# Patient Record
Sex: Female | Born: 1945 | Race: White | Hispanic: No | Smoking: Former smoker
Health system: Southern US, Community
[De-identification: ages and names within clinical notes are randomized; demographics above are authoritative.]

## PROBLEM LIST (undated history)

## (undated) DIAGNOSIS — R0602 Shortness of breath: Secondary | ICD-10-CM

## (undated) DIAGNOSIS — K222 Esophageal obstruction: Secondary | ICD-10-CM

## (undated) DIAGNOSIS — I779 Disorder of arteries and arterioles, unspecified: Secondary | ICD-10-CM

## (undated) DIAGNOSIS — I2510 Atherosclerotic heart disease of native coronary artery without angina pectoris: Secondary | ICD-10-CM

## (undated) DIAGNOSIS — Z951 Presence of aortocoronary bypass graft: Secondary | ICD-10-CM

## (undated) DIAGNOSIS — I739 Peripheral vascular disease, unspecified: Secondary | ICD-10-CM

## (undated) DIAGNOSIS — Z8669 Personal history of other diseases of the nervous system and sense organs: Secondary | ICD-10-CM

## (undated) DIAGNOSIS — F329 Major depressive disorder, single episode, unspecified: Secondary | ICD-10-CM

## (undated) DIAGNOSIS — F32A Depression, unspecified: Secondary | ICD-10-CM

## (undated) DIAGNOSIS — I10 Essential (primary) hypertension: Secondary | ICD-10-CM

## (undated) DIAGNOSIS — M1990 Unspecified osteoarthritis, unspecified site: Secondary | ICD-10-CM

## (undated) DIAGNOSIS — E785 Hyperlipidemia, unspecified: Secondary | ICD-10-CM

## (undated) DIAGNOSIS — F419 Anxiety disorder, unspecified: Secondary | ICD-10-CM

## (undated) DIAGNOSIS — K219 Gastro-esophageal reflux disease without esophagitis: Secondary | ICD-10-CM

## (undated) HISTORY — DX: Disorder of arteries and arterioles, unspecified: I77.9

## (undated) HISTORY — DX: Shortness of breath: R06.02

## (undated) HISTORY — DX: Depression, unspecified: F32.A

## (undated) HISTORY — DX: Major depressive disorder, single episode, unspecified: F32.9

## (undated) HISTORY — PX: HERNIA REPAIR: SHX51

## (undated) HISTORY — PX: TUBAL LIGATION: SHX77

## (undated) HISTORY — DX: Esophageal obstruction: K22.2

## (undated) HISTORY — DX: Atherosclerotic heart disease of native coronary artery without angina pectoris: I25.10

## (undated) HISTORY — DX: Peripheral vascular disease, unspecified: I73.9

## (undated) HISTORY — DX: Hyperlipidemia, unspecified: E78.5

## (undated) HISTORY — PX: OTHER SURGICAL HISTORY: SHX169

## (undated) HISTORY — DX: Essential (primary) hypertension: I10

## (undated) HISTORY — PX: HEMORRHOID SURGERY: SHX153

## (undated) HISTORY — DX: Presence of aortocoronary bypass graft: Z95.1

## (undated) HISTORY — DX: Gastro-esophageal reflux disease without esophagitis: K21.9

## (undated) HISTORY — DX: Personal history of other diseases of the nervous system and sense organs: Z86.69

## (undated) HISTORY — DX: Anxiety disorder, unspecified: F41.9

---

## 1997-07-28 HISTORY — PX: OTHER SURGICAL HISTORY: SHX169

## 1998-07-28 HISTORY — PX: CORONARY ARTERY BYPASS GRAFT: SHX141

## 1998-12-17 ENCOUNTER — Encounter: Payer: Self-pay | Admitting: Vascular Surgery

## 1998-12-18 ENCOUNTER — Inpatient Hospital Stay (HOSPITAL_COMMUNITY): Admission: RE | Admit: 1998-12-18 | Discharge: 1998-12-21 | Payer: Self-pay | Admitting: Vascular Surgery

## 1999-04-10 ENCOUNTER — Inpatient Hospital Stay (HOSPITAL_COMMUNITY): Admission: EM | Admit: 1999-04-10 | Discharge: 1999-04-19 | Payer: Self-pay | Admitting: Cardiology

## 1999-04-15 ENCOUNTER — Encounter: Payer: Self-pay | Admitting: Thoracic Surgery (Cardiothoracic Vascular Surgery)

## 1999-04-16 ENCOUNTER — Encounter: Payer: Self-pay | Admitting: Thoracic Surgery (Cardiothoracic Vascular Surgery)

## 1999-04-17 ENCOUNTER — Encounter: Payer: Self-pay | Admitting: Thoracic Surgery (Cardiothoracic Vascular Surgery)

## 1999-04-18 ENCOUNTER — Encounter: Payer: Self-pay | Admitting: Thoracic Surgery (Cardiothoracic Vascular Surgery)

## 2007-11-26 ENCOUNTER — Ambulatory Visit: Payer: Self-pay | Admitting: Internal Medicine

## 2007-12-03 ENCOUNTER — Ambulatory Visit (HOSPITAL_COMMUNITY): Admission: RE | Admit: 2007-12-03 | Discharge: 2007-12-03 | Payer: Self-pay | Admitting: Internal Medicine

## 2007-12-03 ENCOUNTER — Ambulatory Visit: Payer: Self-pay | Admitting: Internal Medicine

## 2008-10-12 ENCOUNTER — Encounter: Payer: Self-pay | Admitting: Cardiology

## 2008-10-12 ENCOUNTER — Encounter: Payer: Self-pay | Admitting: Physician Assistant

## 2008-10-13 ENCOUNTER — Ambulatory Visit: Payer: Self-pay | Admitting: Internal Medicine

## 2008-10-13 ENCOUNTER — Encounter: Payer: Self-pay | Admitting: Cardiology

## 2008-10-13 ENCOUNTER — Ambulatory Visit: Payer: Self-pay | Admitting: Cardiology

## 2008-10-13 ENCOUNTER — Inpatient Hospital Stay (HOSPITAL_COMMUNITY): Admission: EM | Admit: 2008-10-13 | Discharge: 2008-10-19 | Payer: Self-pay | Admitting: Cardiology

## 2008-10-13 ENCOUNTER — Encounter: Payer: Self-pay | Admitting: Cardiovascular Disease

## 2008-10-15 ENCOUNTER — Encounter: Payer: Self-pay | Admitting: Cardiology

## 2008-10-19 ENCOUNTER — Encounter: Payer: Self-pay | Admitting: Cardiology

## 2008-11-02 ENCOUNTER — Ambulatory Visit: Payer: Self-pay | Admitting: Cardiology

## 2009-02-14 ENCOUNTER — Encounter (INDEPENDENT_AMBULATORY_CARE_PROVIDER_SITE_OTHER): Payer: Self-pay | Admitting: *Deleted

## 2009-02-14 ENCOUNTER — Ambulatory Visit: Payer: Self-pay | Admitting: Cardiology

## 2009-02-21 ENCOUNTER — Ambulatory Visit: Payer: Self-pay | Admitting: Cardiology

## 2009-02-26 ENCOUNTER — Encounter: Payer: Self-pay | Admitting: Internal Medicine

## 2009-03-05 ENCOUNTER — Encounter (INDEPENDENT_AMBULATORY_CARE_PROVIDER_SITE_OTHER): Payer: Self-pay | Admitting: *Deleted

## 2009-03-09 ENCOUNTER — Ambulatory Visit: Payer: Self-pay | Admitting: Cardiology

## 2009-03-13 ENCOUNTER — Encounter: Payer: Self-pay | Admitting: Internal Medicine

## 2009-09-18 ENCOUNTER — Encounter: Payer: Self-pay | Admitting: Cardiology

## 2009-10-17 ENCOUNTER — Encounter: Payer: Self-pay | Admitting: Cardiology

## 2009-10-17 DIAGNOSIS — K219 Gastro-esophageal reflux disease without esophagitis: Secondary | ICD-10-CM | POA: Insufficient documentation

## 2009-10-18 ENCOUNTER — Ambulatory Visit: Payer: Self-pay | Admitting: Cardiology

## 2010-06-24 ENCOUNTER — Ambulatory Visit: Payer: Self-pay | Admitting: Cardiology

## 2010-06-26 ENCOUNTER — Encounter: Payer: Self-pay | Admitting: Cardiology

## 2010-06-28 ENCOUNTER — Encounter (INDEPENDENT_AMBULATORY_CARE_PROVIDER_SITE_OTHER): Payer: Self-pay | Admitting: *Deleted

## 2010-08-27 NOTE — Letter (Signed)
Summary: Engineer, materials at [facility]  [address]   [place], [place] [address]   Phone: [phone]  Fax: [phone]        June 28, 2010 MRN: [id number]    Cathy Sharp [address] [place], [place]  [address]    Dear Ms. Novitski,  Your test ordered by Selena Batten has been reviewed by your physician (or physician assistant) and was found to be normal or stable. Your physician (or physician assistant) felt no changes were needed at this time.  ____ Echocardiogram  ____ Cardiac Stress Test  ____ Lab Work  __X__ Peripheral vascular study of arms, legs or neck  ____ CT scan or X-ray  ____ Lung or Breathing test  ____ Other:   Thank you.   Cyril Loosen, RN, BSN    Duane Boston, M.D., F.A.C.C. Thressa Sheller, M.D., F.A.C.C. Oneal Grout, M.D., F.A.C.C. Cheree Ditto, M.D., F.A.C.C. Daiva Nakayama, M.D., F.A.C.C. Kenney Houseman, M.D., F.A.C.C. Jeanne Ivan, PA-C

## 2010-08-27 NOTE — Assessment & Plan Note (Signed)
Summary: 6 mo ful   Visit Type:  Follow-up Primary Provider:  Dr. Kirstie Peri  CC:  CAD.  History of Present Illness: The patient is seen for followup of coronary artery disease.  I saw her last March, 2011.  She underwent CABG in 2000.  There was a nuclear scan in July, 2010 showing no ischemia.  There has not been a recent echo.  Also the patient underwent abdominal aortic aneurysm repair with an aortobifemoral bypass in 1999.  She has not had a followup Doppler aware of.  Also I do not see any followup carotid Dopplers.  Her last would've been at the time of her surgery.  She's not having any symptoms.  Preventive Screening-Counseling & Management  Alcohol-Tobacco     Smoking Status: quit     Year Started: 30+     Year Quit: 1999  Current Medications (verified): 1)  Plavix 75 Mg Tabs (Clopidogrel Bisulfate) .... Take 1 Tablet By Mouth Once A Day 2)  Aspirin 325 Mg Tabs (Aspirin) .... Take 1 Tablet By Mouth Once A Day 3)  Toprol Xl 50 Mg Xr24h-Tab (Metoprolol Succinate) .... Take 1 Tablet By Mouth Once A Day 4)  Lopid 600 Mg Tabs (Gemfibrozil) .... Take 1 Tablet By Mouth Once A Day 5)  Lipitor 80 Mg Tabs (Atorvastatin Calcium) .... Take 1 Tablet By Mouth Once A Day 6)  Klonopin 0.5 Mg Tabs (Clonazepam) .... Take 2 Tabs At Bedtime 7)  Wellbutrin Xl 150 Mg Xr24h-Tab (Bupropion Hcl) .... Take 1 Tab By Mouth At Bedtime 8)  Ranitidine Hcl 150 Mg Caps (Ranitidine Hcl) .... Take 1 Tablet By Mouth Two Times A Day 9)  Nitrostat 0.4 Mg Subl (Nitroglycerin) .Marland Kitchen.. 1 Tablet Under Tongue At Onset of Chest Pain; You May Repeat Every 5 Minutes For Up To 3 Doses. 10)  Coricidin Hbp Cold/flu 2-325 Mg Tabs (Chlorpheniramine-Acetaminophen) .... Take 2 Tabs Every 6 Hrs As Needed, Started Yesterday  Allergies (verified): 1)  ! Penicillin  Past History:  Past Medical History: DYSLIPIDEMIA (ICD-272.4) HYPERTENSION, BENIGN (ICD-401.1) CAD (ICD-414.00)...PCI.Marland KitchenMarland Kitchen3/2010  /   nuclear.Marland Kitchen.01/2009..no scar or  ischemia CABG September 2000 PVD (ICD-443.9) (status post aortobifemoral bypass grafting,Jan. 1999) GERD Schatzki ring.Marland KitchenMarland KitchenRourk  .Marland Kitchen  Review of Systems       Patient denies fever, chills, headache, sweats, rash, change in vision, change in hearing, chest pain, cough, nausea vomiting, urinary symptoms.  All of the systems are reviewed and are negative.  Vital Signs:  Patient profile:   65 year old female Height:      63 inches Weight:      154.75 pounds Pulse rate:   67 / minute BP sitting:   151 / 85  (left arm) Cuff size:   regular  Vitals Entered By: Hoover Brunette, LPN (June 24, 2010 8:35 AM) CC: CAD Is Patient Diabetic? No Comments 6 month f/u   Physical Exam  General:  Patient is stable today. Head:  head is atraumatic. Eyes:  no xanthelasma. Neck:  no jugular venous distention. Chest Wall:  no chest wall tenderness. Lungs:  lungs are clear.  Respiratory effort is nonlabored. Heart:  cardiac exam reveals an S1-S2.  No clicks. No significant murmurs. Abdomen:  abdomen soft. Msk:  no musculoskeletal deformities. Extremities:  no peripheral edema. Skin:  no skin rashes. Psych:  patient is oriented to person time and place.  Affect is normal.   Impression & Recommendations:  Problem # 1:  GERD (ICD-530.81)  Her updated medication list for this problem  includes:    Ranitidine Hcl 150 Mg Caps (Ranitidine hcl) .Marland Kitchen... Take 1 tablet by mouth two times a day The patient still has significant GERD symptoms.  She will need follow with her primary physician.  Problem # 2:  DYSLIPIDEMIA (ICD-272.4)  Her updated medication list for this problem includes:    Lopid 600 Mg Tabs (Gemfibrozil) .Marland Kitchen... Take 1 tablet by mouth once a day    Lipitor 80 Mg Tabs (Atorvastatin calcium) .Marland Kitchen... Take 1 tablet by mouth once a day The patient is on high dose medications for her lipids.  This is also followed by her primary physician.  Problem # 3:  HYPERTENSION, BENIGN (ICD-401.1)  Her  updated medication list for this problem includes:    Aspirin 325 Mg Tabs (Aspirin) .Marland Kitchen... Take 1 tablet by mouth once a day    Toprol Xl 50 Mg Xr24h-tab (Metoprolol succinate) .Marland Kitchen... Take 1 tablet by mouth once a day Systolic blood pressure slightly increased today.  The patient does have a blood pressure cuff but is not using it recently.  I've instructed her to check her blood pressure home on an intermittent basis.  She is to contact us if her systolic pressures were 140.  Problem # 4:  CAD (ICD-414.00) Patient is having no significant symptoms.  EKG is done today and reviewed by me.  She has old diffuse ST flattening and old incomplete right bundle branch block.  No significant change.  She's doing well.  Problem # 5:  PVD (ICD-443.9) The patient needs followup carotid Doppler.  This we scheduled.  When I see her back in the future we will consider abdominal Doppler to reassess her abdominal aortic aneurysm repair 10 years after treatment.  I see back in 6 months for followup.  Other Orders: EKG w/ Interpretation (93000) Carotid Duplex (Carotid Duplex)  Patient Instructions: 1)  Your physician wants you to follow-up in: 6 months. You will receive a reminder letter in the mail one-two months in advance. If you don't receive a letter, please call our office to schedule the follow-up appointment. 2)  Your physician has requested that you have a carotid duplex. This test is an ultrasound of the carotid arteries in your neck. It looks at blood flow through these arteries that supply the brain with blood. Allow one hour for this exam. There are no restrictions or special instructions. If the results of your test are normal or stable, you will receive a letter. If they are abnormal, the nurse will contact you by phone. 3)  Keep BP record and notify office if BP is running high. Prescriptions: LIPITOR 80 MG TABS (ATORVASTATIN CALCIUM) Take 1 tablet by mouth once a day  #30 x 6   Entered by:    Cyril Loosen, RN, BSN   Authorized by:   Talitha Givens, MD, [facility]   Signed by:   Cyril Loosen, RN, BSN on 06/24/2010   Method used:   Electronically to        [facility] [address] (retail)       [address]       [place], [place]  [address]       Ph: [phone] or [phone]       Fax: [phone]   RxID:   [id number] PLAVIX 75 MG TABS (CLOPIDOGREL BISULFATE) Take 1 tablet by mouth once a day  #30 x 6   Entered by:   Cyril Loosen, RN, BSN  Authorized by:   Talitha Givens, MD, [facility]   Signed by:   Cyril Loosen, RN, BSN on 06/24/2010   Method used:   Electronically to        [facility] [address] (retail)       [address]       [place], [place]  [address]       Ph: [phone] or [phone]       Fax: [phone]   RxID:   [id number]

## 2010-08-27 NOTE — Assessment & Plan Note (Signed)
Summary: 6 MO FU APPT PER FEB REMINDER-SRS   Visit Type:  Follow-up Primary Provider:  Dr. Kirstie Peri  CC:  CAD.  History of Present Illness: The patient is seen for followup of coronary artery disease.  I saw her last August, 2010.  She has no disease.  She underwent CABG in 2000.  She had a PCI in March, 2010.  There was need to do a followup nuclear scan in July, 2010 and there was no scar or ischemia.  Unfortunately the patient has been gaining weight.  She needs to increase her exercise.  She has reflux symptoms but no angina.  Preventive Screening-Counseling & Management  Alcohol-Tobacco     Smoking Status: quit     Year Started: 1960     Year Quit: 1999  Current Medications (verified): 1)  Plavix 75 Mg Tabs (Clopidogrel Bisulfate) .... Take 1 Tablet By Mouth Once A Day 2)  Aspirin 325 Mg Tabs (Aspirin) .... Take 1 Tablet By Mouth Once A Day 3)  Toprol Xl 50 Mg Xr24h-Tab (Metoprolol Succinate) .... Take 1 Tablet By Mouth Once A Day 4)  Lopid 600 Mg Tabs (Gemfibrozil) .... Take 1 Tablet By Mouth Once A Day 5)  Lipitor 80 Mg Tabs (Atorvastatin Calcium) .... Take 1 Tablet By Mouth Once A Day 6)  Klonopin 1 Mg Tabs (Clonazepam) .... Take 1 Tab By Mouth At Bedtime 7)  Wellbutrin Xl 150 Mg Xr24h-Tab (Bupropion Hcl) .... Take 1 Tab By Mouth At Bedtime 8)  Pepcid 20 Mg Tabs (Famotidine) .... Take 1 Tablet By Mouth Once A Day 9)  Nitrostat 0.4 Mg Subl (Nitroglycerin) .Marland Kitchen.. 1 Tablet Under Tongue At Onset of Chest Pain; You May Repeat Every 5 Minutes For Up To 3 Doses.  Allergies: 1)  ! Penicillin  Comments:  Nurse/Medical Assistant: The patient's medications were reviewed with the patient and were updated in the Medication List. Pt verbally confirmed medications.  Cyril Loosen, RN, BSN (October 18, 2009 2:37 PM)  Past History:  Past Medical History: Last updated: 10/17/2009 DYSLIPIDEMIA (ICD-272.4) HYPERTENSION, BENIGN (ICD-401.1) CAD (ICD-414.00)...PCI.Marland KitchenMarland Kitchen3/2010  /    nuclear.Marland Kitchen.01/2009..no scar or ischemia CABG September 2000 PVD (ICD-443.9) (status post aortobifemoral bypass grafting,Jan. 1999) GERD Schatzki ring.Marland KitchenMarland KitchenRourk    Review of Systems       Patient denies fever, chills, headache, sweats, rash, change in vision, change in hearing, chest pain, cough, shortness of breath, urinary symptoms.  She does have symptoms of GERD.All other systems are reviewed and are negative.  Vital Signs:  Patient profile:   65 year old female Height:      63 inches Weight:      165 pounds BMI:     29.33 Pulse rate:   54 / minute BP sitting:   159 / 81  (left arm) Cuff size:   large  Vitals Entered By: Cyril Loosen, RN, BSN (October 18, 2009 2:31 PM)  Nutrition Counseling: Patient's BMI is greater than 25 and therefore counseled on weight management options. CC: CAD Comments No cardiac complaints. Follow up visit.    Physical Exam  General:  Patient is stable. Eyes:  no xanthelasma. Neck:  no jugular venous distention. Lungs:  lungs are clear.  Respiratory effort is nonlabored. Heart:  cardiac exam reveals S1-S2.  No clicks or significant murmurs. Abdomen:  abdomen is soft. Extremities:  no peripheral edema. Psych:  patient is oriented to person time and place.  Affect is.   Impression & Recommendations:  Problem # 1:  GERD (ICD-530.81)  Her updated medication list for this problem includes:    Pepcid 20 Mg Tabs (Famotidine) .Marland Kitchen... Take 1 tablet by mouth once a day The patient does have GERD.  I reminded her that losing weight will also help with this.  Problem # 2:  HYPERTENSION, BENIGN (ICD-401.1)  Her updated medication list for this problem includes:    Aspirin 325 Mg Tabs (Aspirin) .Marland Kitchen... Take 1 tablet by mouth once a day    Toprol Xl 50 Mg Xr24h-tab (Metoprolol succinate) .Marland Kitchen... Take 1 tablet by mouth once a day Blood pressure is clearly higher than I would like to see today.  She will be checking her pressure home and calling pressures to [place].   Unless her blood pressure is clearly normal at home, we will need to add an ACE inhibitor.  Problem # 3:  CAD (ICD-414.00)  Her updated medication list for this problem includes:    Plavix 75 Mg Tabs (Clopidogrel bisulfate) .Marland Kitchen... Take 1 tablet by mouth once a day    Aspirin 325 Mg Tabs (Aspirin) .Marland Kitchen... Take 1 tablet by mouth once a day    Toprol Xl 50 Mg Xr24h-tab (Metoprolol succinate) .Marland Kitchen... Take 1 tablet by mouth once a day    Nitrostat 0.4 Mg Subl (Nitroglycerin) .Marland Kitchen... 1 tablet under tongue at onset of chest pain; you may repeat every 5 minutes for up to 3 doses. Coronary disease is stable at this time.  No further workup.  Patient Instructions: 1)  Your physician recommends that you continue on your current medications as directed. Please refer to the Current Medication list given to you today. 2)  Your physician wants you to follow-up in: 6 months. You will receive a reminder letter in the mail about two months in advance. If you don't receive a letter, please call our office to schedule the follow-up appointment. 3)  Please call our office with your bloodpressure readings. Prescriptions: LIPITOR 80 MG TABS (ATORVASTATIN CALCIUM) Take 1 tablet by mouth once a day  #90 x 3   Entered by:   Carlye Grippe   Authorized by:   Talitha Givens, MD, [facility]   Signed by:   Carlye Grippe on 10/18/2009   Method used:   Electronically to        [facility] (mail-order)             ,          Ph: [phone]       Fax: [phone]   RxID:   [id number] PLAVIX 75 MG TABS (CLOPIDOGREL BISULFATE) Take 1 tablet by mouth once a day  #90 x 3   Entered by:   Carlye Grippe   Authorized by:   Talitha Givens, MD, [facility]   Signed by:   Carlye Grippe on 10/18/2009   Method used:   Electronically to        [facility]* (mail-order)             ,          Ph: [phone]       Fax: [phone]   RxID:   [id number]

## 2010-08-27 NOTE — Miscellaneous (Signed)
  Clinical Lists Changes  Problems: Added new problem of CORONARY ARTERY BYPASS GRAFT, HX OF (ICD-V45.81) Added new problem of GERD (ICD-530.81) Observations: Added new observation of PAST MED HX: DYSLIPIDEMIA (ICD-272.4) HYPERTENSION, BENIGN (ICD-401.1) CAD (ICD-414.00)...PCI.Marland KitchenMarland Kitchen3/2010  /   nuclear.Marland Kitchen.01/2009..no scar or ischemia CABG September 2000 PVD (ICD-443.9) (status post aortobifemoral bypass grafting,Jan. 1999) GERD Schatzki ring.Marland KitchenMarland KitchenRourk    (10/17/2009 12:11)       Past History:  Past Medical History: DYSLIPIDEMIA (ICD-272.4) HYPERTENSION, BENIGN (ICD-401.1) CAD (ICD-414.00)...PCI.Marland KitchenMarland Kitchen3/2010  /   nuclear.Marland Kitchen.01/2009..no scar or ischemia CABG September 2000 PVD (ICD-443.9) (status post aortobifemoral bypass grafting,Jan. 1999) GERD Schatzki ring.Marland KitchenMarland KitchenRourk

## 2010-08-27 NOTE — Letter (Signed)
Summary: Appointment- Rescheduled  [facility] [address]   [place], [place] [address]   Phone: [phone]  Fax: [phone]     September 18, 2009 MRN: [id number]     Cathy Sharp [address] [place], [place]  [address]   Dear Ms. Mignone,  Due to a change in our office schedule, your appointment on  March 18,2011 at  1:30pm  must be changed.    Your new appointment is scheduled for October 18, 2009 at 2:30 pm.  We look forward to participating in your health care needs.   Please contact us at the number listed above at your earliest convenience to reschedule this appointment if needed.     Sincerely,  Glass blower/designer

## 2010-11-07 LAB — CBC
HCT: 39 % (ref 36.0–46.0)
HCT: 41.1 % (ref 36.0–46.0)
Hemoglobin: 12.6 g/dL (ref 12.0–15.0)
Hemoglobin: 13.3 g/dL (ref 12.0–15.0)
Hemoglobin: 14.4 g/dL (ref 12.0–15.0)
MCHC: 34.1 g/dL (ref 30.0–36.0)
MCHC: 35 g/dL (ref 30.0–36.0)
MCV: 90 fL (ref 78.0–100.0)
MCV: 90.1 fL (ref 78.0–100.0)
Platelets: 186 10*3/uL (ref 150–400)
Platelets: 204 10*3/uL (ref 150–400)
Platelets: 226 10*3/uL (ref 150–400)
Platelets: 271 10*3/uL (ref 150–400)
RBC: 4.04 MIL/uL (ref 3.87–5.11)
RBC: 4.53 MIL/uL (ref 3.87–5.11)
RDW: 13.2 % (ref 11.5–15.5)
WBC: 11.2 10*3/uL — ABNORMAL HIGH (ref 4.0–10.5)
WBC: 11.9 10*3/uL — ABNORMAL HIGH (ref 4.0–10.5)
WBC: 9.2 10*3/uL (ref 4.0–10.5)

## 2010-11-07 LAB — HEPARIN LEVEL (UNFRACTIONATED)
Heparin Unfractionated: 0.39 IU/mL (ref 0.30–0.70)
Heparin Unfractionated: 0.6 IU/mL (ref 0.30–0.70)
Heparin Unfractionated: 0.64 IU/mL (ref 0.30–0.70)

## 2010-11-07 LAB — BASIC METABOLIC PANEL
BUN: 15 mg/dL (ref 6–23)
BUN: 15 mg/dL (ref 6–23)
BUN: 22 mg/dL (ref 6–23)
BUN: 22 mg/dL (ref 6–23)
CO2: 24 mEq/L (ref 19–32)
CO2: 25 mEq/L (ref 19–32)
Calcium: 9.3 mg/dL (ref 8.4–10.5)
Calcium: 9.4 mg/dL (ref 8.4–10.5)
Calcium: 9.7 mg/dL (ref 8.4–10.5)
Chloride: 103 mEq/L (ref 96–112)
Chloride: 104 mEq/L (ref 96–112)
Chloride: 105 mEq/L (ref 96–112)
Creatinine, Ser: 1.43 mg/dL — ABNORMAL HIGH (ref 0.4–1.2)
Creatinine, Ser: 1.46 mg/dL — ABNORMAL HIGH (ref 0.4–1.2)
Creatinine, Ser: 1.52 mg/dL — ABNORMAL HIGH (ref 0.4–1.2)
GFR calc non Af Amer: 35 mL/min — ABNORMAL LOW (ref >60)
GFR calc non Af Amer: 42 mL/min — ABNORMAL LOW (ref >60)
Glucose, Bld: 104 mg/dL — ABNORMAL HIGH (ref 70–99)
Glucose, Bld: 119 mg/dL — ABNORMAL HIGH (ref 70–99)
Glucose, Bld: 132 mg/dL — ABNORMAL HIGH (ref 70–99)
Potassium: 4 mEq/L (ref 3.5–5.1)
Potassium: 4.3 mEq/L (ref 3.5–5.1)
Potassium: 4.3 mEq/L (ref 3.5–5.1)
Sodium: 137 mEq/L (ref 135–145)
Sodium: 140 mEq/L (ref 135–145)

## 2010-11-07 LAB — COMPREHENSIVE METABOLIC PANEL
ALT: 23 U/L (ref 0–35)
Alkaline Phosphatase: 73 U/L (ref 39–117)
CO2: 25 mEq/L (ref 19–32)
Calcium: 8.9 mg/dL (ref 8.4–10.5)
GFR calc non Af Amer: 33 mL/min — ABNORMAL LOW (ref >60)
Glucose, Bld: 111 mg/dL — ABNORMAL HIGH (ref 70–99)
Potassium: 3.7 mEq/L (ref 3.5–5.1)
Sodium: 140 mEq/L (ref 135–145)
Total Bilirubin: 0.5 mg/dL (ref 0.3–1.2)

## 2010-11-07 LAB — LIPID PANEL
Cholesterol: 224 mg/dL — ABNORMAL HIGH (ref 0–200)
HDL: 38 mg/dL — ABNORMAL LOW (ref >39)

## 2010-11-07 LAB — PROTIME-INR
INR: 1.1 (ref 0.00–1.49)
Prothrombin Time: 14.9 seconds (ref 11.6–15.2)

## 2010-12-10 NOTE — Cardiovascular Report (Signed)
NAMECRYSTALLE, POPWELL         ACCOUNT NO.:  [id number]   MEDICAL RECORD NO.:  [id number]          PATIENT TYPE:  INP   LOCATION:  2508                         FACILITY:  [facility]   PHYSICIAN:  Bruce R. Juanda Chance, MD, FACCDATE OF BIRTH:  [birth year]   DATE OF PROCEDURE:  10/18/2008  DATE OF DISCHARGE:                            CARDIAC CATHETERIZATION   CLINICAL HISTORY:  Ms. Frenz is a 65 year old and was admitted to  the hospital with unstable angina.  She had previous bypass surgery.  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  We  intervened on the native LAD with a XIENCE drug-eluting stent 2 days ago  and we brought her back for stage intervention on the chronically  totally occluded circumflex artery today.   PROCEDURE:  The procedure was performed via the right femoral artery  using arterial sheath and a 6-French CLS 3.5 guiding catheter with side  holes.  We used a long PT2 Light Support wire.  The lesion was a chronic  total lesion, but there appeared to be a very small channel we were able  to navigate across with the slick wire.  We then dilated the lesion with  a 2.0 x 15 mm apex balloon performing one inflation up to 8 atmospheres  for 30 seconds.  We then deployed a 2.5 x 23 mm XIENCE stent deploying  this with one inflation of 11 atmospheres for 30 seconds.  We then  postdilated the stent with a 2.75 x 20 mm Niotaze Voyager performing one  inflation up to 15 atmospheres for 30 seconds.  Final diagnostics were  then performed with the guiding catheter.  The patient tolerated the  procedure well and left the laboratory in satisfactory condition.   RESULTS:  Initially stenosis in the mid circumflex artery was 99% with  TIMI II flow.  Following stenting, this improved to 0% with TIMI III  flow.   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   DISPOSITION:  The patient returned to post angio room for further  observation.      Bruce Elvera Lennox Juanda Chance, MD, [facility]  Electronically Signed     BRB/MEDQ  D:  10/18/2008  T:  10/19/2008  Job:  [id number]   cc:   Thomas C. Daleen Squibb, MD, [facility]  Kirstie Peri, MD  Luis Abed, MD, [facility]

## 2010-12-10 NOTE — Assessment & Plan Note (Signed)
[facility]                          [facility] OFFICE NOTE   NAME:Cathy Sharp, Cathy Sharp                MRN:          [id number]  DATE:11/02/2008                            DOB:          03-15-1946    Ms. Plyler is seen back after recent hospitalization for  interventions to her coronary arteries.  She is doing well.  She had  presented to [facility] with a tightness in her upper chest and a  feeling of a choking sensation.  She has had some GI symptoms in the  past and she was evaluated at [facility].  She had a stress Myoview and  had 2 mm of ST depression with shortness of breath and chest pain.  The  nuclear images actually were inadequate, but it was felt that with her  overall presentation, cardiac catheterization should be done.  She went  to [facility].  She underwent cath on October 16, 2008.  She had severe  native vessel disease with 90% stenosis of the proximal LAD, and a 90%  stenosis of the ostium of the first diagonal, and 99% stenosis of the  proximal to mid circumflex, and possible total occlusion of a small sub  branch of the posterior descending.  She had an occluded vein graft to  the posterior descending and an occluded free right internal mammary  artery to the marginal and a patent vein graft to the diagonal and  atretic and occluded LIMA.  On that day, Dr. Juanda Chance placed a drug-  eluting stent into the native LAD.  Plans were made for her to come back  2 days later, at which time, she had successful PCI of a chronic total  occlusion of the mid circumflex with a drug-eluting stent.  She did  well.  She did not have any major complications.  Ultimately, she was  discharged home and is now back for followup.   She has not had any return of the chest discomfort.  She is active.  Her  cath site is healing nicely.  She has no shortness of breath.  She is  doing well.   PAST MEDICAL HISTORY:   ALLERGIES:  PENICILLIN.   MEDICATIONS:  1. Plavix 75.  2. Aspirin 325.  3. Toprol-XL 50.  4. Lopid 600 b.i.d.  5. Lipitor 80.  6. Klonopin one.  7. Wellbutrin 150.  8. Pepcid.  9. Multivitamin.  10.Calcium.   OTHER MEDICAL PROBLEMS:  See the list below.   REVIEW OF SYSTEMS:  Today, she has no fevers or chills.  She has no skin  rashes.  There is no headache.  There is no change in her vision.  There  is no change in her hearing.  She is not having shortness of breath.  There is no cough.  There is no chest pain.  She has no GI symptoms.  There are no GU symptoms.  There is no major musculoskeletal problems.  All other systems are reviewed and are negative.   PHYSICAL EXAMINATION:  VITAL SIGNS:  Blood pressure is 149/88 with a  pulse of 65.  Weight is 148 pounds.  GENERAL:  The patient is oriented to person, time, and place.  Affect is  normal.  HEENT:  No xanthelasma.  She has normal extraocular motion.  NECK:  There are no carotid bruits.  There is no jugular venous  distention.  LUNGS:  Clear.  Respiratory effort is not labored.  CARDIAC:  An S1 with an S2.  There are no clicks or significant murmurs.  ABDOMEN:  Soft.  The right groin is examined completely.  There is a  normal pulse.  There is resolving mild ecchymoses.  There is no  hematoma.  There are no bruits heard.  EXTREMITIES:  She has no peripheral edema.   EKG shows sinus rhythm with an RSR prime in V1 and some decreased  anterior R-wave progression.   PROBLEMS:  1. Coronary artery disease post coronary artery bypass graft in 2000.  2. Drug-eluting stent to the left anterior descending on October 16, 2008, and drug-eluting stent to the chronic total of the circumflex      with a drug-eluting stent on October 18, 2008.  She is stable.  She      is on the appropriate medications.  I will see her back in 3      months.  3. Status post aortobifemoral graft in 1999 by Dr. Gretta Began.  4. Gastroesophageal reflux disease with a history of  Schatzki's ring      and dilatation.  5. Dyslipidemia.  6. Hypertension.  7. Anxiety and depression.  8. History of carpal tunnel.  9. Hemorrhoidectomy.  10.Tubal ligation.  11.Cyst removal from the left breast.   The patient is stable.  No change in her medicines.  I will see her back  in 3 months.     Luis Abed, MD, [facility]  Electronically Signed    JDK/MedQ  DD: 11/02/2008  DT: 11/02/2008  Job #: [id number]   cc:   Kirstie Peri, MD

## 2010-12-10 NOTE — Assessment & Plan Note (Signed)
[facility]                          [facility] OFFICE NOTE   NAME:Cathy Sharp, Cathy Sharp                MRN:          [id number]  DATE:02/14/2009                            DOB:          Oct 07, 1945    Cathy Sharp is seen for the followup of her coronary disease.  She has  a significant disease.  She is post CABG in 2000.  In March 2010, she  had a drug-eluting stent to a chronic total occlusion of the circumflex.  She had severe native disease at the time of that cath.  There was an  occluded vein graft to the posterior descended and an occluded free  right internal mammary to the marginal and a patent grafts to the  diagonal with an atretic and occluded LIMA.  There was a drug-eluting  stent placed to the LAD.  She then had a followup procedure with a drug-  eluting stent to a totally occluded circumflex.  Since that time, she  has done relatively well.  The patient does have significant GERD, and  she has history of a Schatzki ring.  She recently has had increase in  what she calls indigestion.  This seems to occur more when she is lying  down.  She also thinks it may be worse since Lopid was started several  months ago.  It is not necessarily exertional.  However, since she had  this around the time of her event in March 2010, we have to consider  that this could be ischemic.   PAST MEDICAL HISTORY:   ALLERGIES:  PENICILLIN.   MEDICATIONS:  Plavix, aspirin, metoprolol, Lopid, Lipitor, Klonopin,  Wellbutrin, and Pepcid.   OTHER MEDICAL PROBLEMS:  See the complete list on my note of November 02, 2008.   REVIEW OF SYSTEMS:  The patient has no fevers, chills, or skin rashes.  There are no sweats.  She has no headaches.  There is no change in  vision or hearing.  There is no cough.  There is no chest pain.  There  is no shortness of breath.  She has no GU symptoms.  She does have a  symptom of indigestion as described in the HPI.  There is  question of  trivial edema.  There are no major musculoskeletal complaints today.  All other systems are reviewed and are negative.   PHYSICAL EXAMINATION:  VITAL SIGNS:  Blood pressure is 140/80 with a  pulse of 57.  GENERAL:  The patient is oriented to person, time, and place.  Affect is  normal.  HEENT:  No xanthelasma.  She has normal extraocular motion.  There are  no carotid bruits.  There is no jugular venous distention.  LUNGS:  Clear.  Respiratory effort is not labored.  CARDIAC:  S1 with an S2.  There are no clicks or significant murmurs.  ABDOMEN:  Soft.  She has no significant peripheral edema.   EKG is to be done today and reviewed by me.   ASSESSMENT:  1. The patient has significant coronary disease.  She is post PCI in      March 2010.  We  need to proceed with a stress Myoview scan while      walking on the treadmill to see if she develops significant      ischemia.  She will also stop her Lopid for now to see if she feels      better.  2. History of aortobifemoral graft in 1999.  3. Gastroesophageal reflux disease with a history of Schatzki ring.      The patient needs GI followup, but first we will be sure about her      cardiac status.     Luis Abed, MD, [facility]  Electronically Signed    JDK/MedQ  DD: 02/14/2009  DT: 02/15/2009  Job #: [id number]   cc:   R. Roetta Sessions, M.D.  Kirstie Peri, MD

## 2010-12-10 NOTE — Cardiovascular Report (Signed)
[name], Sharp         ACCOUNT NO.:  [id number]   MEDICAL RECORD NO.:  [id number]           PATIENT TYPE:   LOCATION:                                 FACILITY:   PHYSICIAN:  Bruce R. Juanda Chance, MD, FACCDATE OF BIRTH:  12/10/1945   DATE OF PROCEDURE:  10/16/2008  DATE OF DISCHARGE:                            CARDIAC CATHETERIZATION   Cardiac catheterization and percutaneous coronary intervention and graft  study.   CLINICAL HISTORY:  Ms. Cathy Sharp is 65 years old and had bypass surgery  in 2000 by Dr. Dorris Fetch.  She was recently admitted to [facility] with chest pain, had a Myoview scan with exercise that showed  chest pain and EKG changes, although the scan itself did not show any  ischemia.  Her ejection fraction was normal.  She was transferred here  for further evaluation with angiography.   PROCEDURE:  The procedure was performed via the right femoral artery and  arterial sheath and a 6-French preformed coronary catheters.  A front  wall arterial puncture was performed and Omnipaque contrast was used.  A  LIMA catheter was used for injection of the LIMA graft.  After the  completion of the diagnostic study, we made a decision to proceed with  intervention on the lesion in the native LAD.   The patient was given bivalirudin bolus infusion and was given 600 mg  Plavix load and previously had been given 4 chewable aspirin.  We passed  a Prowater wire down the LAD without difficulty.  We predilated the  lesion with a 2.25- x 15-mm apex balloon performing one inflation of 10  atmospheres for 27 seconds.  We then deployed a XIENCE stent which was a  2.5- x 50-mm stent deploying this with one inflation of 11 atmospheres  for 27 seconds.  We postdilated with a 2.75- x 12-mm noncompliant  Voyager performing two inflations up to 16 atmospheres for 27 seconds.  Final diagnostics were then performed with the guiding catheter.  The  patient tolerated the procedure well  and left the laboratory in  satisfactory condition.   We did not close the right femoral artery since we entered through an  aortobifem graft.   RESULTS:  Left main coronary artery:  The left main coronary artery was  free of disease.   Left anterior descending artery:  The left anterior descending artery  gave rise to a diagonal branch and several small septal perforators.  There was a 90% stenosis in the proximal LAD just after the diagonal  branch.  There was a nice stenosis in the ostium of the diagonal branch  with competing flow distally.   The circumflex artery:  The circumflex artery gave rise to an atrial  branch and then had a 99% stenosis with TIMI II flow down the circumflex  marginal vessel.   The right coronary artery:  The right coronary had irregularities in its  proximal portion.  It gave rise to a conus branch, a right ventricle  branch, a posterior descending branch, and a posterolateral branch.  It  is possible there was a subbranch of the posterior descending branch  which was completely occluded, but this appeared to be a small vessel.   The saphenous vein graft to the diagonal branch of the LAD was patent  and functioned normally.   The saphenous vein graft to the PDA of the right coronary artery was  completely occluded proximally.   The LIMA graft to the LAD was atretic and occluded in its midportion.   The free LIMA graft to the marginal branch of the circumflex was  completely occluded at its origin.   No left ventriculogram was performed to preserve contrast.   CONCLUSION:  1. Coronary artery status post coronary bypass graft surgery in 2000.  2. Severe native vessel disease with 90% stenosis in the proximal left      anterior descending and 90% ostial stenosis of the first diagonal      branch, 99% stenosis in the proximal to mid circumflex artery, and      possible total occlusion of a small subbranch of the posterior      descending branch of  the right coronary artery.  3. Occluded vein graft to the posterior descending branch of the right      coronary artery, occluded free right internal mammary artery graft      to the marginal branch of the circumflex artery, patent vein graft      to the diagonal branch of the left anterior descending, and atretic      and occluded internal mammary artery to the left anterior      descending.  4. Successful percutaneous coronary intervention of the native left      anterior descending using a XIENCE drug-eluting stent with      improvement in center narrowing from 90% to 0%.   DISPOSITION:  The patient then presents for further observation.  We  will plan intervention on the functionally chronically occluded  circumflex artery on Wednesday.        Bruce Elvera Lennox Juanda Chance, MD, [facility]  Electronically Signed     BRB/MEDQ  D:  10/16/2008  T:  10/17/2008  Job:  [id number]   cc:   Kirstie Peri, MD  Learta Codding, MD,FACC

## 2010-12-10 NOTE — Assessment & Plan Note (Signed)
[facility]                          [facility]ICE NOTE   NAME:Sharp, Cathy SOSSAMON                MRN:          [id number]  DATE:03/09/2009                            DOB:          1945/08/26    [name] is seen for followup.  See my note of February 14, 2009.  She  has significant disease.  She had a PCI in March 2010.  She has had some  indigestion and we had to be sure that this was not significant  ischemia.  At the same time, Lopid was stopped.  As we stopped the  Lopid, she clearly began to feel better.  In addition, she had a stress  nuclear scan on February 21, 2009.  She walked on a treadmill with a  workload of 10.1 mets.  There were some abnormal ST changes.  There was  no chest pain.  There was no significant sign of scar or ischemia.  She  did have a hypertensive response.   Today in the office, she is feeling well.   PHYSICAL EXAMINATION:  Blood pressure is 148/86 with a pulse of 61.  I  have reviewed all of the information with the patient.  It seems that  her symptoms were most likely related to Lopid and she is doing well at  this time.   Problems are listed on my note of February 14, 2009.  Her cardiac status is  stable and more complete problem list is on my note of November 02, 2008.  I  will see her for Cardiology followup in 6 months.  She will remain off  Lopid, but on Lipitor.     Luis Abed, MD, [facility]  Electronically Signed    JDK/MedQ  DD: 03/09/2009  DT: 03/10/2009  Job #: [id number]   cc:   Kirstie Peri, MD  R. Roetta Sessions, M.D.

## 2010-12-10 NOTE — Cardiovascular Report (Signed)
[name], Sharp         ACCOUNT NO.:  [id number]   MEDICAL RECORD NO.:  [id number]          PATIENT TYPE:  INP   LOCATION:  2508                         FACILITY:  [facility]   PHYSICIAN:  Bruce R. Juanda Chance, MD, FACCDATE OF BIRTH:  01-Dec-1945   DATE OF PROCEDURE:  10/16/2008  DATE OF DISCHARGE:                            CARDIAC CATHETERIZATION   PROCEDURE:  Cardiac catheterization and percutaneous coronary  intervention.   CLINICAL HISTORY:  Cathy Sharp is 65 years old and had bypass surgery  by Dr. Dorris Fetch 10 years ago.  She was admitted to the hospital with  chest pain and had a Myoview scan which was done with exercise and  during which she had chest pain and EKG changes.  A Myoview scan itself  showed good LV function and no evidence of ischemia.  She was  transferred here for further evaluation with angiography.   PROCEDURE IN DETAIL:  The procedure was performed via the right femoral  artery using arterial sheath and 6-French preformed coronary catheters.  A front-wall arterial puncture was performed and Omnipaque contrast was  used.  After completion of the diagnostic study, we made a decision to  proceed with intervention on the lesion in the proximal LAD.  The LIMA  graft was injected with a LIMA catheter.   The patient was given Angiomax bolus infusion and was given 600 mg of  Plavix and had previously been given 4 chewable aspirin.  We used a Q-  3.5 guiding catheter with side holes.  We passed a Prowater wire down  the LAD without difficulty.  We predilated the lesion with a 2.5 x 15 mm  apex balloon performing one inflation at 10 atmospheres for 27 seconds.  We then deployed a 2.5 x 15 mm Xience stent performing this with one  inflation at 11 atmospheres for 27 seconds.  We post-dilated with a 2.75  x 12 mm [place] Voyager performing two inflation up to 16 atmospheres for 27  seconds.  Final diagnostics were then performed through the guiding  catheter.  The  patient tolerated the procedure well and left the laboratory in  satisfactory condition.   RESULTS:  Left main coronary artery:  The left main coronary artery was  free of significant disease.   Left anterior descending artery:  The left anterior ascending artery had  90% stenosis located just after the takeoff of the first septal  perforator and first diagonal branch.  There was competing flow in the  first diagonal branch and there was 90% ostial stenosis in the first  diagonal branch.   Circumflex artery:  The circumflex artery gave rise to an atrial branch  and then had a 99% stenosis feeding a marginal branch.   Right coronary artery:  The right coronary artery had some  irregularities in its midportion.  The posterior descending branch  appeared to have a subbranch that was totally occluded, although it  appeared likely that this was a small vessel.   The saphenous vein graft to the right coronary artery was completely  occluded at its origin.   The LIMA graft to the LAD was atretic and possibly  completely occluded  at its midportion.  It certainly was functionally occluded.   Dictation ended at this point.      Bruce Elvera Lennox Juanda Chance, MD, [facility]  Electronically Signed     BRB/MEDQ  D:  10/16/2008  T:  10/17/2008  Job:  [id number]   cc:   Learta Codding, MD,FACC  Kirstie Peri, MD

## 2010-12-10 NOTE — Consult Note (Signed)
NAME:  Cathy Sharp, Cathy Sharp         ACCOUNT NO.:  [id number]   MEDICAL RECORD NO.:  [id number]         PATIENT TYPE:  END   LOCATION:  DAY                           FACILITY:  [facility]   PHYSICIAN:  R. Roetta Sessions, M.D. DATE OF BIRTH:  10-18-1945   DATE OF CONSULTATION:  11/26/2007  DATE OF DISCHARGE:                                 CONSULTATION   REASON FOR CONSULTATION:  Hemorrhoids, consult for colonoscopy.   REFERRING PHYSICIAN:  Dr. Sherryll Burger.   HISTORY OF PRESENT ILLNESS:  The patient is a 65 year old female.  She  is referred through the courtesy of Dr. Sherryll Burger for colonoscopy.  When  called to perform a triage, the patient noted that she was having some  problems with hematochezia as well as some swallowing problems, which  has started recently, therefore, she was asked to come into the office  to discuss things further prior to embarking on procedures.  Basically,  she tells me, she has had a history of chronic alteration in bowel  movements, which range from loose stools to constipation.  She has had  intermittent bright red blood noticed on the toilet paper with wiping.  For years now, she gives history of hemorrhoidectomy in 1976 and has  been battling problems with hemorrhoids since that time.  She denies any  abdominal pain.  Denies any nausea or vomiting.  She has gone up to 4  days without a bowel movement.  Recently, she has tried very few  medications for her chronic constipation.  She has never had a  colonoscopy.   She also wants to be evaluated for a new problem where she feels like  her food is getting stuck retrosternally.  She has had dysphagia to  solid foods for about a month now.  She tells me that usually she can  swallow a food bolus with liquid, it does seem to pass within a few  minutes.  Yesterday, she was eating a chicken breast and felt that it  just sat there for quite some time, it would not pass up.  She  eventually got relief from this symptom.  She  complains of nightly  indigestion.  She has tried over-the-counter acid reducer for the last  year, which seems to help, but not 100%.  She was on Tagamet years ago.   PAST MEDICAL AND SURGICAL HISTORY:  1. Hypertension.  2. Depression.  3. Hypercholesterolemia.  4. Osteopenia.  5. Peripheral vascular disease.  6. Ischemic cardiomyopathy.  She is followed by [facility] in      [place].  7. Last EF was 58%.  8. She had a CABG in September 2000.  9. She has history of PVCs.  10.Peripheral vascular disease.  11.Lower extremity claudication, aortofemoral bypass grafting done on      August 15, 1997, followed by Dr. Gretta Began.  12.She has had essential hypertension.  13.Hyperlipidemia.  14.She has had hemorrhoidectomy in 1976.  15.Tubal ligation.  16.Left carpal tunnel release.  17.She has had a ventral hernia repair.  18.She has had benign cyst removed from her left breast.   CURRENT MEDICATIONS:  1. Altace 2.5 mg  daily.  2. Toprol-XL 25 mg daily.  3. Wellbutrin XL 150 mg daily.  4. Klonopin 0.5 mg two nightly.  5. Multivitamin daily.  6. Calcium and vitamin D daily.  7. Aspirin 81 mg daily.  8. Over-the-counter acid reducer once daily.   ALLERGIES:  PENICILLIN causes swelling.   FAMILY HISTORY:  There is no known family history of colorectal  carcinoma or other chronic GI problems.  Father deceased, 62, of lung  carcinoma.  Mother deceased, 87, of motor vehicle accident.  One sister  had a coronary artery disease and one healthy sister.  Three siblings  with history significant for coronary disease and emphysema.   SOCIAL HISTORY:  The patient is married.  She has four healthy children.  She is employed with [employer].  She quit smoking in 1999, has a 38-pack-  year history.  Denies any alcohol or drug use.   REVIEW OF SYSTEMS:  See HPI, otherwise negative.   PHYSICAL EXAMINATION:  VITAL SIGNS:  Weight 148 pounds, height is 63  inches, temperature 98.4,  blood pressure 170/94, and pulse 64.  GENERAL:  The patient is a well-developed and well-nourished Caucasian  female in no acute distress.  HEENT:  Sclerae clear, nonicteric.  Conjunctivae are pink.  Oropharynx  pink and moist.  She has upper dentures intact.  NECK:  Supple without any mass or thyromegaly.  CHEST:  Heart regular rhythm.  Normal S1 and S2 without murmurs, clicks,  rubs or gallops.  LUNGS:  Clear to auscultation bilaterally.  ABDOMEN:  Positive bowel sounds x4.  No bruits auscultated.  Soft,  nontender, and nondistended without palpable masses or hepatomegaly.  No  rebound tenderness or guarding.  EXTREMITIES:  Without clubbing or edema bilaterally.  RECTAL:  Deferred given pending colonoscopy.   IMPRESSION:  The patient is a 65 year old female with two concerns  today:  She is going to need colonoscopy both for screening and diagnostic  purposes given her intermittent hematochezia, which I suspect may be due  to hemorrhoids or fissure, but we do need to rule out colorectal  neoplasia.  Her chronic constipation could be contributing to her  symptoms, but she does have alternating swings of loose stools as well.   One-year-history of solid food dysphagia, is going to require further  evaluation to rule out esophageal web ring stricture or occult  malignancy.   PLAN:  1. EGD with possible esophageal dilatation and diagnostic colonoscopy      with Dr. Jena Gauss in near future.  I discussed both procedures.  Other      risks and benefits to include, but not limited to bleeding,      infection, perforation, or drug reaction.  She agrees with plan and      consent will be obtained.  2. Should hold her aspirin for 5 days prior to the procedure.  3. Omeprazole.  Begin omeprazole 20 mg daily.  I have given her a      week's worth of sample of Prilosec 20 mg as well as a prescription      for 31 with one refill.  4. Begin MiraLax 17 g daily as needed for constipation.  5. GERD  and constipation, literature given for her review.   Thank you Dr. Sherryll Burger for allowing for allowing [place] to participate in the  care of this patient.       Lorenza Burton, N.P.      Jonathon Bellows, M.D.  Electronically Signed    KJ/MEDQ  D:  11/26/2007  T:  11/27/2007  Job:  [id number]

## 2010-12-10 NOTE — Op Note (Signed)
NAME:  Cathy Sharp         ACCOUNT NO.:  [id number]   MEDICAL RECORD NO.:  [id number]          PATIENT TYPE:  AMB   LOCATION:  DAY                           FACILITY:  [facility]   PHYSICIAN:  R. Roetta Sessions, M.D. DATE OF BIRTH:  07/29/1945   DATE OF PROCEDURE:  12/03/2007  DATE OF DISCHARGE:                               OPERATIVE REPORT   PROCEDURE:  Esophagogastroduodenoscopy and Maloney dilation followed by  diagnostic colonoscopy.   INDICATIONS FOR PROCEDURE:  Cathy Sharp is a 65 year old lady  with esophageal dysphagia to solid food, has had symptoms for over a  year, and also intermittent low-volume hematochezia.  She has never had  her upper GI tract evaluated nor she ever had her lower GI tract  evaluated.  There is no family history of colorectal cancer.  EGD and  colonoscopy are now being done.  Potential risks, benefits,  alternatives, and limitations have been reviewed.  Questions answered.  She is agreeable.  Please see documentation in the medical record.   PROCEDURE NOTE:  O2 saturation, blood pressure, pulse, and respirations  monitored throughout the entire procedure.   CONSCIOUS SEDATION:  Versed 6 mg IV and Demerol 125 mg IV in divided  doses.  Cetacaine spray for topical pharyngeal anesthesia.   INSTRUMENTATION:  Pentax video chip system.   FINDINGS:  EGD:  Examination of the tubular esophagus revealed a  prominent Schatzki's ring overlying esophageal mucosa, the remainder of  the esophagus appeared normal.  EG junction was easily traversed.   Stomach:  All gastric cavity was emptied and insufflated well with air.  Throughout examination of the gastric mucosa including retroflexed view  of the proximal stomach, esophagogastric junction demonstrated only a  small hiatal hernia.  Pylorus was patent and easily traversed.  Examination of the bulb and second portion revealed no abnormalities.   THERAPEUTIC/DIAGNOSTIC MANEUVERS PERFORMED:  The scope  was withdrawn.  A  56-French Maloney dilator was passed to full insertion.  A look back  revealed the ring had been ruptured without apparent complication.  The  patient tolerated the procedure well and was prepared for colonoscopy.   Digital rectal exam revealed no abnormalities.  Endoscopic findings:  The prep was adequate.  Colon:  Colonic mucosa was surveyed from the  rectosigmoid junction through the left transverse and right colon to the  appendiceal orifice, ileocecal valve, and cecum.  These structures were  well seen and photographed for the record.  From this level, the scope  was slowly withdrawn, and all previously mentioned mucosal surfaces were  carefully inspected.  The colonic mucosa appeared entirely normal.  The  scope was pulled down to the rectum where thorough examination of the  rectal mucosa was undertaken.  The rectal vault was somewhat small, and  the patient kept venting air through the anus and for this reason, I was  unable to retroflex, although I attempted to do so, but for the same  reason, I was able to see the rectal mucosa, all parts very well in the  anal canal.  She had a friable anal canal, otherwise the rectal mucosa  appeared normal.  The patient tolerated both procedures well and was  reactive in Endoscopy.   IMPRESSION:  Esophagogastroduodenoscopy, prominent Schatzki's ring,  otherwise normal esophagus, small hiatal hernia, otherwise normal  stomach, D1 and D2, status post dilation disruption Schatzki's ring as  described above.   Colonoscopy findings:  Friable anal canal, otherwise normal rectum.   RECOMMENDATIONS:  1. Continue Prilosec 20 mg orally daily.  2. Hemorrhoid literature provided to [name], a 10-day course of      Anusol-HC suppositories one per rectum at bedtime.  [name]      let me know if bleeding does not cease.  She may or may not need      her esophagus dilated again in the future.  Any recurrence in       symptoms, would dictate the approach in the future.      Jonathon Bellows, M.D.  Electronically Signed     RMR/MEDQ  D:  12/03/2007  T:  12/04/2007  Job:  [id number]   cc:   Kirstie Peri, MD  Fax: [phone]

## 2010-12-10 NOTE — Discharge Summary (Signed)
Cathy Sharp, Cathy Sharp         ACCOUNT NO.:  [id number]   MEDICAL RECORD NO.:  [id number]          PATIENT TYPE:  INP   LOCATION:  2508                         FACILITY:  [facility]   PHYSICIAN:  Bruce R. Juanda Chance, MD, FACCDATE OF BIRTH:  1946/03/21   DATE OF ADMISSION:  10/13/2008  DATE OF DISCHARGE:  10/19/2008                               DISCHARGE SUMMARY   PRIMARY CARDIOLOGIST:  Luis Abed, MD, [facility]   PRIMARY CARE PHYSICIAN:  Dr. Sherryll Burger.   PROCEDURES PERFORMED DURING HOSPITALIZATION:  1. Cardiac catheterization performed by Dr. Juanda Chance on October 16, 2008.      a.     Severe native vessel disease with occluded vein graft to the       posterior descending branch of the right coronary artery, occluded       free right internal mammary artery graft to the marginal branch of       the circumflex and patent vein graft to diagonal branch of the       left anterior descending artery, atretic and occluded internal       mammary artery to the left anterior descending artery.  2. Successful percutaneous coronary intervention of the native left      anterior descending artery using a XIENCE drug-eluting stent with      improvement in the center narrowing from 90% to 0%.  3. Successful percutaneous coronary intervention of chronic total      occlusion of the mid circumflex artery using a XIENCE drug-eluting      stent with improvement in the center narrowing from 99% to 0% and      improvement of flow from TIMI 2 to TIMI 3 flow dated October 18, 2008, per Dr. Juanda Chance.   FINAL DISCHARGE DIAGNOSES:  1. Multivessel coronary artery disease.      a.     Four-vessel coronary artery bypass grafting in 2000, left       internal mammary artery to left anterior descending, saphenous       vein graft to right coronary artery.  2. Status post cardiac catheterization on October 16, 2008, revealing      occluded left internal mammary artery to left anterior descending      graft with drug-eluting  stent placed per Dr. Juanda Chance.  3. Status post percutaneous coronary intervention of the chronic total      occlusion of the mid circumflex artery using a XIENCE drug-eluting      stent.  4. Peripheral vascular disease.      a.     Status post an aortofemoral bypass grafting in January 1999.  5. Gastroesophageal reflux disease with Schatzki ring.      a.     Status post Elease Hashimoto dilatation in 2009.  6. Dyslipidemia.  7. Hypertension.  8. Anxiety and depression.   HOSPITAL COURSE:  This is a 65 year old female patient of Dr. Willa Rough, who was transferred from [facility] after evaluation there  for cardiac catheterization.  The patient was admitted there on October 13, 2008, with chest pain and underwent a stress Myoview revealing ST  depression of 2 mm with complaints of shortness of breath and chest  pain.  They were unable to do nuclear imaging secondary to inadequate  study.  The patient was transferred for cardiac catheterization in this  setting.   The patient did undergo cardiac catheterization on October 16, 2008, which  was completed by Dr. Juanda Chance, revealing an 90% LAD proximal, 90%  diagonal, and  90% mid circumflex, right coronary artery 40% distal with  totally occluded PLA, SVG with occluded LIMA to LAD with atretic SVG to  right coronary artery was totaled and RIMA to OM was totaled.   The patient subsequently had a PCI of the LAD using a XIENCE drug-  eluting stent reducing it from 90% to 0% and then the following day, had  a stage PCI to the circumflex using a XIENCE drug-eluting stent as well  reducing it from 99% to 0% stenosis with improvement of TIMI flow from 2-  3.   The patient tolerated both procedures very well without evidence of  bleeding hematoma or signs of infection and was followed up the  following day on the October 19, 2008, by Dr. Juanda Chance for continued  evaluation, and the patient was found to be stable.  The patient was  started on Plavix 75  mg daily, increased aspirin to 325 mg daily and  addition of Lopressor 80 mg daily.  She is to follow up with Dr. Myrtis Ser in  a couple of 4 weeks and will return to work in 1 week.  The patient was  anxious to return home and has been given discharge instructions.   DISCHARGE LABORATORIES:  Sodium 138, potassium 4.0, chloride 105, CO2 of  25, glucose 119, BUN 15, and creatinine 1.46.  Hemoglobin 14.4,  hematocrit 41.1, white blood cells 11.9, and platelets 226.  Chest x-ray  dated October 15, 2008, revealing postoperative changes and bibasilar  atelectasis versus scarring.  EKG dated October 19, 2008, revealing normal  sinus rhythm with an incomplete right bundle-branch block with inferior  lateral ST abnormalities.   DISCHARGE MEDICATIONS:  1. Plavix 75 mg daily (new prescription provided).  2. Aspirin 325 mg daily.  3. Toprol-XL 50 mg daily.  4. Lopid 600 mg twice a day.  5. Lipitor 80 mg daily (new prescription provided).  6. Klonopin 1 mg at bedtime.  7. Wellbutrin 150 mg at bedtime.  8. Pepcid 20 mg daily.  9. Nitroglycerin 0.4 mg sublingual p.r.n. chest pain.   ALLERGIES:  PENICILLIN.   FOLLOWUP PLANS AND APPOINTMENT:  1. The patient will follow up with Dr. Willa Rough in [place] office on      November 02, 2008, at 2:15 p.m.  2. The patient has been given post-cardiac catheterization      instructions with emphasis on the right groin site for      evidence of bleeding, hematoma, or infection.  3. The patient is to follow up with primary care physician Dr. Sherryll Burger      for continued medical management.   TIME SPENT WITH THE PATIENT TO INCLUDE PHYSICIAN TIME:  35 minutes.      Bettey Mare. Lyman Bishop, NP      Everardo Beals. Juanda Chance, MD, [facility]  Electronically Signed    KML/MEDQ  D:  10/19/2008  T:  10/19/2008  Job:  [id number]   cc:   Dr. Sherryll Burger

## 2011-02-24 ENCOUNTER — Other Ambulatory Visit: Payer: Self-pay | Admitting: Cardiology

## 2011-02-25 ENCOUNTER — Other Ambulatory Visit: Payer: Self-pay | Admitting: Cardiology

## 2011-09-16 ENCOUNTER — Encounter: Payer: Self-pay | Admitting: Cardiology

## 2011-09-17 ENCOUNTER — Ambulatory Visit (INDEPENDENT_AMBULATORY_CARE_PROVIDER_SITE_OTHER): Payer: Medicare Other | Admitting: Cardiology

## 2011-09-17 ENCOUNTER — Encounter: Payer: Self-pay | Admitting: Cardiology

## 2011-09-17 VITALS — BP 184/81 | HR 68 | Ht 63.0 in | Wt 155.8 lb

## 2011-09-17 DIAGNOSIS — K222 Esophageal obstruction: Secondary | ICD-10-CM | POA: Insufficient documentation

## 2011-09-17 DIAGNOSIS — F419 Anxiety disorder, unspecified: Secondary | ICD-10-CM | POA: Insufficient documentation

## 2011-09-17 DIAGNOSIS — I779 Disorder of arteries and arterioles, unspecified: Secondary | ICD-10-CM | POA: Insufficient documentation

## 2011-09-17 DIAGNOSIS — I2510 Atherosclerotic heart disease of native coronary artery without angina pectoris: Secondary | ICD-10-CM

## 2011-09-17 DIAGNOSIS — I10 Essential (primary) hypertension: Secondary | ICD-10-CM

## 2011-09-17 DIAGNOSIS — R0602 Shortness of breath: Secondary | ICD-10-CM | POA: Insufficient documentation

## 2011-09-17 DIAGNOSIS — F329 Major depressive disorder, single episode, unspecified: Secondary | ICD-10-CM

## 2011-09-17 DIAGNOSIS — E785 Hyperlipidemia, unspecified: Secondary | ICD-10-CM | POA: Insufficient documentation

## 2011-09-17 DIAGNOSIS — Z951 Presence of aortocoronary bypass graft: Secondary | ICD-10-CM | POA: Insufficient documentation

## 2011-09-17 DIAGNOSIS — K219 Gastro-esophageal reflux disease without esophagitis: Secondary | ICD-10-CM | POA: Insufficient documentation

## 2011-09-17 DIAGNOSIS — Z8669 Personal history of other diseases of the nervous system and sense organs: Secondary | ICD-10-CM | POA: Insufficient documentation

## 2011-09-17 DIAGNOSIS — I739 Peripheral vascular disease, unspecified: Secondary | ICD-10-CM | POA: Insufficient documentation

## 2011-09-17 MED ORDER — AMLODIPINE BESYLATE 5 MG PO TABS: 5.0000 mg | ORAL_TABLET | Freq: Every day | ORAL | Status: DC

## 2011-09-17 MED ORDER — CLOPIDOGREL BISULFATE 75 MG PO TABS: 75.0000 mg | ORAL_TABLET | Freq: Every day | ORAL | Status: DC

## 2011-09-17 MED ORDER — METOPROLOL SUCCINATE ER 50 MG PO TB24: 50.0000 mg | ORAL_TABLET | Freq: Every day | ORAL | Status: DC

## 2011-09-17 NOTE — Patient Instructions (Signed)
Follow up as scheduled for nurse visit and with Dr. Myrtis Ser. Start Norvasc (amlodipine) 5 mg daily.

## 2011-09-17 NOTE — Assessment & Plan Note (Signed)
Today her systolic blood pressure is somewhat higher than usual. I will be adding amlodipine 5 mg daily. She will followup soon for followup blood pressure checks. I believe her pressure will normalize easily.

## 2011-09-17 NOTE — Progress Notes (Signed)
HPI Patient is seen today to followup coronary artery disease. I saw her last November, 2011. She underwent bypass surgery in 2000. She had a nuclear exercise test in July, 2010. There was no ischemia. She has not had any chest pain. She's not having any significant shortness of breath. Her husband died within the past year of recurrent lung cancer. She is dealing with this adequately.  Historically the patient has been very compliant with her medications. This entire note outlines all of her care including her cardiac care. Historically she has had slight elevation in her blood pressure. Today it is somewhat more elevated. We will be starting a medication. I expect her to respond rapidly.  As part of today's evaluation I have reviewed all of the patient's old cardiac history and completely updated the new electronic medical record.   Allergies  Allergen Reactions  . Penicillins     Current Outpatient Prescriptions  Medication Sig Dispense Refill  . aspirin 325 MG tablet Take 325 mg by mouth daily.      Marland Kitchen buPROPion (WELLBUTRIN XL) 150 MG 24 hr tablet Take 150 mg by mouth daily.      . clonazePAM (KLONOPIN) 0.5 MG tablet Take 1 mg by mouth at bedtime.      . metoprolol succinate (TOPROL-XL) 50 MG 24 hr tablet Take 50 mg by mouth daily. Take with or immediately following a meal.      . MULTIPLE VITAMIN PO Take 1 tablet by mouth daily.      . nitroGLYCERIN (NITROSTAT) 0.4 MG SL tablet Place 0.4 mg under the tongue every 5 (five) minutes as needed.      Marland Kitchen PLAVIX 75 MG tablet TAKE 1 TABLET ONCE DAILY  30 each  6  . ranitidine (ZANTAC) 150 MG capsule Take 150 mg by mouth 2 (two) times daily.        History   Social History  . Marital Status: Widowed    Spouse Name: N/A    Number of Children: 4  . Years of Education: N/A   Occupational History  . UNEMPLOYED    Social History Main Topics  . Smoking status: Former Smoker -- 1.0 packs/day    Types: Cigarettes    Quit date: 07/28/1997  .  Smokeless tobacco: Not on file   Comment: Year Quit: 1999  . Alcohol Use: No  . Drug Use: No  . Sexually Active: Not on file   Other Topics Concern  . Not on file   Social History Narrative  . No narrative on file    Family History  Problem Relation Age of Onset  . Heart attack Sister     deceased  . Heart attack Brother 62    deceased    Past Medical History  Diagnosis Date  . Hypertension   . CAD (coronary artery disease)     PCI, March, 2010  /   nuclear, July, 2010, no scar or ischemia  . PAD (peripheral artery disease)     Aortobifem bypass, January, 1999  . GERD (gastroesophageal reflux disease)   . Anxiety and depression   . Hx of carpal tunnel syndrome   . Shortness of breath   . Hx of CABG     2000  . Dyslipidemia   . Schatzki's ring     Rourk  . Carotid artery disease     Doppler November, 2011, mild atherosclerotic disease, less than 50% stenosis  LICA,  no significant stenosis or ICA,, vertebral is antegrade  Past Surgical History  Procedure Date  . Coronary artery bypass graft 2000  . Hemorrhoid surgery   . Tubal ligation   . Cyst removal from breast     LEFT  . Hernia repair   . Aortobifemoral bypass grafting 1999    ROS  Patient denies fever, chills, headache, sweats, rash, change in vision, change in hearing, chest pain, cough, nausea vomiting, urinary symptoms. All other systems are reviewed and are negative.  PHYSICAL EXAM Patient is oriented to person time and place. Affect is normal. Head is atraumatic. There is no xanthelasma. There is no jugulovenous distention. There are no carotid bruits. Lungs are clear. Respiratory effort is nonlabored. Cardiac exam reveals S1 and S2. There no clicks or significant murmurs. The abdomen is soft. There is no peripheral edema. There are no musculoskeletal deformities. There no skin rashes. Filed Vitals:   09/17/11 1305  BP: 184/81  Pulse: 68  Height: 5\' 3"  (1.6 m)  Weight: 155 lb 12.8 oz (70.67  kg)   EKG is done today and reviewed by me. She has normal sinus rhythm. She has nonspecific ST-T wave changes. The EKG is unchanged since the tracing in 2011.  ASSESSMENT & PLAN

## 2011-09-17 NOTE — Assessment & Plan Note (Signed)
The patient is not having any significant shortness of breath. No further workup.

## 2011-09-17 NOTE — Assessment & Plan Note (Signed)
The patient's coronary artery disease is stable. She is not having any significant symptoms. She had an exercise test in 2010 revealing no significant ischemia. She does not need a followup exercise test at this time.

## 2011-09-17 NOTE — Assessment & Plan Note (Signed)
The patient has mild carotid artery disease. She had a Doppler in November, 2011. There was minimal disease. She will need of followup Doppler in a year.

## 2011-09-22 ENCOUNTER — Ambulatory Visit (INDEPENDENT_AMBULATORY_CARE_PROVIDER_SITE_OTHER): Payer: Medicare Other | Admitting: *Deleted

## 2011-09-22 VITALS — BP 152/78 | HR 61 | Resp 18 | Ht 63.0 in | Wt 154.8 lb

## 2011-09-22 DIAGNOSIS — I10 Essential (primary) hypertension: Secondary | ICD-10-CM

## 2011-09-22 NOTE — Progress Notes (Signed)
Patient in for BP recheck. Patient has taken all meds and no side effects. No c/o dizziness,chest pain, or sob.

## 2011-09-23 ENCOUNTER — Telehealth: Payer: Self-pay | Admitting: *Deleted

## 2011-09-23 NOTE — Telephone Encounter (Signed)
Message copied by Eustace Moore on Tue Sep 23, 2011  2:00 PM ------      Message from: Rande Brunt      Created: Tue Sep 23, 2011 12:09 PM      Regarding: RE: BP results for DMV       That's fine            Gene            ----- Message -----         From: Thalia Bloodgood, LPN         Sent: 09/23/2011   9:06 AM           To: Rozell Searing, PA      Subject: BP results for DMV                                       Patient need 3 different readings to send to [facility]      Can she take her own tomorrow and call with results because her copay is $40 and she has paid this 2 times in last week

## 2011-09-23 NOTE — Progress Notes (Signed)
BP much improved from recent OV. Continue current medication regimen.

## 2011-09-23 NOTE — Telephone Encounter (Signed)
Patient walked into office saying she was informed to come to our office and have BP checked three days in a row. Today BP 149/87 HR-58

## 2011-11-10 ENCOUNTER — Ambulatory Visit (INDEPENDENT_AMBULATORY_CARE_PROVIDER_SITE_OTHER): Payer: Medicare Other | Admitting: Cardiology

## 2011-11-10 ENCOUNTER — Encounter: Payer: Self-pay | Admitting: Cardiology

## 2011-11-10 VITALS — BP 165/76 | HR 63 | Ht 63.0 in | Wt 155.0 lb

## 2011-11-10 DIAGNOSIS — I2510 Atherosclerotic heart disease of native coronary artery without angina pectoris: Secondary | ICD-10-CM

## 2011-11-10 MED ORDER — AMLODIPINE BESYLATE 10 MG PO TABS: 10.0000 mg | ORAL_TABLET | Freq: Every day | ORAL | Status: DC

## 2011-11-10 NOTE — Assessment & Plan Note (Addendum)
Coronary disease is stable. She had a recent stress test. There is no ischemia. She has good left ventricular wall motion. She is quite stable. No further workup is needed.   Cardiac clearance to drive a bus:    The patient is an excellent candidate to drive a bus. There are further adjustments on her blood pressure meds. I expect her blood pressure be completely controlled by the time of her next visit. 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

## 2011-11-10 NOTE — Patient Instructions (Addendum)
   Follow up in 6 weeks.  Increase Amlodipine to 10 mg daily. You may take _2_ of your _5_ mg tablets daily until gone, and then get new prescription filled for _10_ mg tablets. A new prescription was sent to your pharmacy to reflect this change.    Call the office with BP readings as directed.

## 2011-11-10 NOTE — Progress Notes (Signed)
  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 She's also seen to follow blood pressure. When I saw her last we added amlodipine to her medicines. Her blood pressure is now close to being completely controlled. The dose will be increased today and there were be early followup. I expect her blood pressure to be normalized by then.  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 She is very reliable. She does have coronary disease. She had bypass surgery in the past. She has had a recent stress test revealing no ischemia. She takes her medicines very reliably. She is compliant completely with her medicines.  She is not having any chest pain or shortness of breath.  Allergies  Allergen Reactions  . Penicillins     Current Outpatient Prescriptions  Medication Sig Dispense Refill  . amLODipine (NORVASC) 5 MG tablet Take 1 tablet (5 mg total) by mouth daily.  30 tablet  6  . aspirin 325 MG tablet Take 325 mg by mouth daily.      Marland Kitchen buPROPion (WELLBUTRIN XL) 150 MG 24 hr tablet Take 150 mg by mouth daily.      . clonazePAM (KLONOPIN) 0.5 MG tablet Take 1 mg by mouth at bedtime.      . clopidogrel (PLAVIX) 75 MG tablet Take 1 tablet (75 mg total) by mouth daily.  30 tablet  6  . metoprolol succinate (TOPROL-XL) 50 MG 24 hr tablet Take 1 tablet (50 mg total) by mouth daily. Take with or immediately following a meal.  30 tablet  6  . MULTIPLE VITAMIN PO Take 1 tablet by mouth daily.      . nitroGLYCERIN (NITROSTAT) 0.4 MG SL tablet Place 0.4 mg under the tongue every 5 (five) minutes as needed.      . ranitidine (ZANTAC) 150 MG capsule Take 150 mg by mouth 2 (two) times daily.        History   Social History  . Marital Status: Widowed    Spouse Name: N/A    Number of Children: 4  . Years of Education: N/A   Occupational History  . UNEMPLOYED    Social History Main Topics  . Smoking status: Former Smoker -- 1.0 packs/day    Types: Cigarettes   Quit date: 07/28/1997  . Smokeless tobacco: Not on file   Comment: Year Quit: 1999  . Alcohol Use: No  . Drug Use: No  . Sexually Active: Not on file   Other Topics Concern  . Not on file   Social History Narrative  . No narrative on file    Family History  Problem Relation Age of Onset  . Heart attack Sister     deceased  . Heart attack Brother 38    deceased    ROS  Patient denies fever, chills, headache, sweats, rash, change in vision, change in hearing, chest pain, cough, nausea vomiting, urinary symptoms. All other systems are reviewed and are negative.  PHYSICAL EXAM Patient is oriented to person time and place. Affect is normal. There is no jugular venous distention. Lungs are clear. Respiratory effort is nonlabored. Cardiac exam reveals S1 and S2. There no clicks or significant murmurs. The abdomen is soft. There is no peripheral edema.  Filed Vitals:   11/10/11 1322  BP: 165/76  Pulse: 63  Height: 5\' 3"  (1.6 m)  Weight: 155 lb (70.308 kg)     ASSESSMENT & PLAN

## 2011-12-23 ENCOUNTER — Encounter: Payer: Self-pay | Admitting: Cardiology

## 2011-12-23 ENCOUNTER — Ambulatory Visit (INDEPENDENT_AMBULATORY_CARE_PROVIDER_SITE_OTHER): Payer: Medicare Other | Admitting: Cardiology

## 2011-12-23 VITALS — BP 149/75 | HR 63 | Ht 63.0 in | Wt 153.0 lb

## 2011-12-23 DIAGNOSIS — I10 Essential (primary) hypertension: Secondary | ICD-10-CM

## 2011-12-23 DIAGNOSIS — I2510 Atherosclerotic heart disease of native coronary artery without angina pectoris: Secondary | ICD-10-CM

## 2011-12-23 MED ORDER — LISINOPRIL 40 MG PO TABS: 40.0000 mg | ORAL_TABLET | Freq: Every day | ORAL | Status: DC

## 2011-12-23 NOTE — Assessment & Plan Note (Addendum)
Blood pressure remains slightly elevated. Will increase her lisinopril to 40 mg daily. She will need followup labs at some point. Half to be sure what has been done with her primary physician.

## 2011-12-23 NOTE — Assessment & Plan Note (Signed)
Coronary disease is stable. No change in therapy. 

## 2011-12-23 NOTE — Progress Notes (Signed)
HPI Patient is seen today for followup of coronary disease and hypertension. 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 We are working on optimal treatment of her blood pressure. She had significant swelling from increased amlodipine. The dose was reduced back to 5 mg and lisinopril was added. Her blood pressure is only slightly elevated today. She's tolerating the current medicines.   Allergies  Allergen Reactions  . Penicillins     Current Outpatient Prescriptions  Medication Sig Dispense Refill  . amLODipine (NORVASC) 10 MG tablet Take 1 tablet (10 mg total) by mouth daily.  30 tablet  6  . aspirin 325 MG tablet Take 325 mg by mouth daily.      Marland Kitchen buPROPion (WELLBUTRIN XL) 150 MG 24 hr tablet Take 150 mg by mouth daily.      . clonazePAM (KLONOPIN) 0.5 MG tablet Take 1 mg by mouth at bedtime.      . clopidogrel (PLAVIX) 75 MG tablet Take 1 tablet (75 mg total) by mouth daily.  30 tablet  6  . metoprolol succinate (TOPROL-XL) 50 MG 24 hr tablet Take 1 tablet (50 mg total) by mouth daily. Take with or immediately following a meal.  30 tablet  6  . MULTIPLE VITAMIN PO Take 1 tablet by mouth daily.      . nitroGLYCERIN (NITROSTAT) 0.4 MG SL tablet Place 0.4 mg under the tongue every 5 (five) minutes as needed.      . ranitidine (ZANTAC) 150 MG capsule Take 150 mg by mouth 2 (two) times daily.        History   Social History  . Marital Status: Widowed    Spouse Name: N/A    Number of Children: 4  . Years of Education: N/A   Occupational History  . UNEMPLOYED    Social History Main Topics  . Smoking status: Former Smoker -- 1.0 packs/day    Types: Cigarettes    Quit date: 07/28/1997  . Smokeless tobacco: Not on file   Comment: Year Quit: 1999  . Alcohol Use: No  . Drug Use: No  . Sexually Active: Not on file   Other Topics Concern  . Not on file   Social History Narrative  . No narrative on file    Family History  Problem Relation Age of Onset    . Heart attack Sister     deceased  . Heart attack Brother 31    deceased    Past Medical History  Diagnosis Date  . Hypertension   . CAD (coronary artery disease)     PCI, March, 2010  /   nuclear, July, 2010, no scar or ischemia  . PAD (peripheral artery disease)     Aortobifem bypass, January, 1999  . GERD (gastroesophageal reflux disease)   . Anxiety and depression   . Hx of carpal tunnel syndrome   . Shortness of breath   . Hx of CABG     2000  . Dyslipidemia   . Schatzki's ring     Rourk  . Carotid artery disease     Doppler November, 2011, mild atherosclerotic disease, less than 50% stenosis  LICA,  no significant stenosis or ICA,, vertebral is antegrade    Past Surgical History  Procedure Date  . Coronary artery bypass graft 2000  . Hemorrhoid surgery   . Tubal ligation   . Cyst removal from breast     LEFT  . Hernia repair   .  Aortobifemoral bypass grafting 1999    ROS  Patient denies fever, chills, headache, sweats, rash, change in vision, change in hearing, chest pain, cough, nausea vomiting, urinary symptoms. All other systems are reviewed and are negative.  PHYSICAL EXAM   Patient's oriented to person time and place. Affect is normal. There is no jugular venous distention. Lungs are clear. Respiratory effort is nonlabored. Cardiac exam reveals S1 and S2. There no clicks or significant murmurs. The abdomen is soft. The patient does have trace peripheral edema.  Filed Vitals:   12/23/11 1406  Height: 5\' 3"  (1.6 m)     ASSESSMENT & PLAN

## 2011-12-23 NOTE — Patient Instructions (Signed)
   Increase Lisinopril to 40mg  daily (may take 2 tabs of 20mg  till finish current supply) Follow up in  3 weeks

## 2012-01-14 ENCOUNTER — Ambulatory Visit: Payer: Medicare Other | Admitting: Cardiology

## 2012-08-23 ENCOUNTER — Other Ambulatory Visit: Payer: Self-pay | Admitting: Cardiology

## 2012-08-23 MED ORDER — CLOPIDOGREL BISULFATE 75 MG PO TABS: 75.0000 mg | ORAL_TABLET | Freq: Every day | ORAL | Status: DC

## 2016-02-04 ENCOUNTER — Encounter: Payer: Self-pay | Admitting: *Deleted

## 2016-02-04 ENCOUNTER — Encounter: Payer: Self-pay | Admitting: Cardiology

## 2016-02-04 ENCOUNTER — Ambulatory Visit (INDEPENDENT_AMBULATORY_CARE_PROVIDER_SITE_OTHER): Payer: Medicare HMO | Admitting: Cardiology

## 2016-02-04 VITALS — BP 134/75 | HR 76 | Ht 63.0 in | Wt 148.4 lb

## 2016-02-04 DIAGNOSIS — E785 Hyperlipidemia, unspecified: Secondary | ICD-10-CM

## 2016-02-04 DIAGNOSIS — I6523 Occlusion and stenosis of bilateral carotid arteries: Secondary | ICD-10-CM | POA: Diagnosis not present

## 2016-02-04 DIAGNOSIS — I739 Peripheral vascular disease, unspecified: Secondary | ICD-10-CM

## 2016-02-04 DIAGNOSIS — I2510 Atherosclerotic heart disease of native coronary artery without angina pectoris: Secondary | ICD-10-CM

## 2016-02-04 NOTE — Progress Notes (Signed)
Clinical Summary Cathy Sharp is a 70 y.o.female seen today as a new patient, she was last seen by Dr Myrtis SerKatz in 2013. She is referrred by Dr Sherryll BurgerShah for the following medical problems.   1. CAD - previous PCI 09/2008 for UA, received DES to LCX - history of prior CABG in 2000 at [facility].  - reports episode of chest pain in April. Had stress test and was negative by her pcp at that time, results are not available.  - no recent SOB or DOE. Walks on treadmill 20min daily, tolerates well. Work with silver sneakers.  - compliant with meds. She has been ASA/plavix since 2010.   2. Hyperlipidemia - muscle aches on lipitor, now on pravastatin and tolerating well  3. Carotid stenosis - mild bilateral disease 2011  - no recent neuro symptoms.   4. PAD - 1999 had aortobifem bypass, no recent f/u - no recent leg pains.  Past Medical History  Diagnosis Date  . Hypertension   . CAD (coronary artery disease)     PCI, March, 2010  /   nuclear, July, 2010, no scar or ischemia  . PAD (peripheral artery disease)     Aortobifem bypass, January, 1999  . GERD (gastroesophageal reflux disease)   . Anxiety and depression   . Hx of carpal tunnel syndrome   . Shortness of breath   . Hx of CABG     2000  . Dyslipidemia   . Schatzki's ring     Rourk  . Carotid artery disease     Doppler November, 2011, mild atherosclerotic disease, less than 50% stenosis  LICA,  no significant stenosis or ICA,, vertebral is antegrade     Allergies  Allergen Reactions  . Penicillins      Current Outpatient Prescriptions  Medication Sig Dispense Refill  . aspirin 325 MG tablet Take 325 mg by mouth daily.    Marland Kitchen. buPROPion (WELLBUTRIN XL) 150 MG 24 hr tablet Take 150 mg by mouth daily.    . clonazePAM (KLONOPIN) 0.5 MG tablet Take 1 mg by mouth at bedtime.    . clopidogrel (PLAVIX) 75 MG tablet Take 1 tablet (75 mg total) by mouth daily. 30 tablet 3  . lisinopril (PRINIVIL,ZESTRIL) 40 MG tablet Take 1 tablet  (40 mg total) by mouth daily. 30 tablet 6  . metoprolol succinate (TOPROL-XL) 50 MG 24 hr tablet Take 1 tablet (50 mg total) by mouth daily. Take with or immediately following a meal. 30 tablet 6  . MULTIPLE VITAMIN PO Take 1 tablet by mouth daily.    . nitroGLYCERIN (NITROSTAT) 0.4 MG SL tablet Place 0.4 mg under the tongue every 5 (five) minutes as needed.    . ranitidine (ZANTAC) 150 MG capsule Take 150 mg by mouth 2 (two) times daily.     No current facility-administered medications for this visit.     Past Surgical History  Procedure Laterality Date  . Coronary artery bypass graft  2000  . Hemorrhoid surgery    . Tubal ligation    . Cyst removal from breast      LEFT  . Hernia repair    . Aortobifemoral bypass grafting  1999     Allergies  Allergen Reactions  . Penicillins       Family History  Problem Relation Age of Onset  . Heart attack Sister     deceased  . Heart attack Brother [id number]    deceased     Social History  Ms. Forde reports that she quit smoking about 18 years ago. Her smoking use included Cigarettes. She smoked 1.00 pack per day. She does not have any smokeless tobacco history on file. Ms. Fahy reports that she does not drink alcohol.   Review of Systems CONSTITUTIONAL: No weight loss, fever, chills, weakness or fatigue.  HEENT: Eyes: No visual loss, blurred vision, double vision or yellow sclerae.No hearing loss, sneezing, congestion, runny nose or sore throat.  SKIN: No rash or itching.  CARDIOVASCULAR: per HPI RESPIRATORY: No shortness of breath, cough or sputum.  GASTROINTESTINAL: No anorexia, nausea, vomiting or diarrhea. No abdominal pain or blood.  GENITOURINARY: No burning on urination, no polyuria NEUROLOGICAL: No headache, dizziness, syncope, paralysis, ataxia, numbness or tingling in the extremities. No change in bowel or bladder control.  MUSCULOSKELETAL: No muscle, back pain, joint pain or stiffness.  LYMPHATICS: No enlarged  nodes. No history of splenectomy.  PSYCHIATRIC: No history of depression or anxiety.  ENDOCRINOLOGIC: No reports of sweating, cold or heat intolerance. No polyuria or polydipsia.  Marland Kitchen   Physical Examination Filed Vitals:   02/04/16 1343  BP: 134/75  Pulse: 76   Filed Vitals:   02/04/16 1343  Height:  (1.6 m)  Weight: 148 lb 6.4 oz (67.314 kg)    Gen: resting comfortably, no acute distress HEENT: no scleral icterus, pupils equal round and reactive, no palptable cervical adenopathy,  CV: RRR, no m/r/g, no jvd Resp: Clear to auscultation bilaterally GI: abdomen is soft, non-tender, non-distended, normal bowel sounds, no hepatosplenomegaly MSK: extremities are warm, no edema.  Skin: warm, no rash Neuro:  no focal deficits Psych: appropriate affect   Diagnostic Studies 09/2008 Cath RESULTS: Left main coronary artery: The left main coronary artery was free of significant disease.  Left anterior descending artery: The left anterior ascending artery had 90% stenosis located just after the takeoff of the first septal perforator and first diagonal Ziona Wickens. There was competing flow in the first diagonal Siana Panameno and there was 90% ostial stenosis in the first diagonal Babatunde Seago.  Circumflex artery: The circumflex artery gave rise to an atrial Emmert Roethler and then had a 99% stenosis feeding a marginal Bleu Moisan.  Right coronary artery: The right coronary artery had some irregularities in its midportion. The posterior descending Omelia Marquart appeared to have a subbranch that was totally occluded, although it appeared likely that this was a small vessel.  The saphenous vein graft to the right coronary artery was completely occluded at its origin.  The LIMA graft to the LAD was atretic and possibly completely occluded at its midportion. It certainly was functionally occluded.   09/2008 Cath Invertention RESULTS: Initially stenosis in the mid circumflex artery was  99% with TIMI II flow. Following stenting, this improved to 0% with TIMI III flow.  CONCLUSION: Successful PCI of the chronic total occlusion of the mid circumflex artery using a XIENCE drug-eluting stent with improvement in center narrowing from 99% to 0% and improvement of flow from TIMI II to TIMI III flow.  DISPOSITION: The patient returned to post angio room for further observation.    Assessment and Plan  1. CAD - no recent symptoms. She reports recent negative stress test ordered by her pcp, we will request results and also recent echo she reports as well - ekg in clinic shows SR, RBBB, LPFB. - change ASA to  daily - she has been on ASA/plavix since 2010. I will review records in more detail to see if indication to continue.   2.  Carotid stenosis - we will repeat carotid US  3. PAD - she will resume routine f/u with vascular  4. Hyperlipidemia - did not tolerate high dose statin, doing well on pravastatin - request pcp labs   F/u 6 months    Antoine Poche, M.D.

## 2016-02-04 NOTE — Patient Instructions (Signed)
Your physician wants you to follow-up in: 6 MONTHS WITH DR. BRANCH You will receive a reminder letter in the mail two months in advance. If you don't receive a letter, please call our office to schedule the follow-up appointment.  Your physician has recommended you make the following change in your medication:   DECREASE ASPIRIN 81 MG DAILY  Your physician has requested that you have a carotid duplex. This test is an ultrasound of the carotid arteries in your neck. It looks at blood flow through these arteries that supply the brain with blood. Allow one hour for this exam. There are no restrictions or special instructions.  You have been referred to VASCULAR DR. EARLY  Thank you for choosing [facility]!!

## 2016-02-07 ENCOUNTER — Ambulatory Visit: Payer: Medicare HMO

## 2016-02-07 DIAGNOSIS — I6523 Occlusion and stenosis of bilateral carotid arteries: Secondary | ICD-10-CM

## 2016-02-11 ENCOUNTER — Telehealth: Payer: Self-pay | Admitting: *Deleted

## 2016-02-11 NOTE — Telephone Encounter (Signed)
Pt aware, routed to pcp 

## 2016-02-11 NOTE — Telephone Encounter (Signed)
-----   Message from Antoine PocheJonathan F Branch, MD sent at 02/11/2016  2:58 PM EDT ----- Carotid US shows only mild blockages on both sides, we will continue to monitor  Dominga FerryJ Branch MD

## 2016-03-06 ENCOUNTER — Other Ambulatory Visit: Payer: Self-pay | Admitting: Vascular Surgery

## 2016-03-06 DIAGNOSIS — Z48812 Encounter for surgical aftercare following surgery on the circulatory system: Secondary | ICD-10-CM

## 2016-05-22 ENCOUNTER — Encounter: Payer: Self-pay | Admitting: Vascular Surgery

## 2016-05-27 ENCOUNTER — Ambulatory Visit (INDEPENDENT_AMBULATORY_CARE_PROVIDER_SITE_OTHER): Payer: Medicare HMO | Admitting: Vascular Surgery

## 2016-05-27 ENCOUNTER — Encounter (HOSPITAL_COMMUNITY): Payer: Self-pay

## 2016-05-27 ENCOUNTER — Ambulatory Visit (HOSPITAL_COMMUNITY)
Admission: RE | Admit: 2016-05-27 | Discharge: 2016-05-27 | Disposition: A | Discharge status: Home / Self Care | Payer: Medicare HMO | Source: Ambulatory Visit | Attending: Vascular Surgery | Admitting: Vascular Surgery

## 2016-05-27 ENCOUNTER — Encounter: Payer: Self-pay | Admitting: Vascular Surgery

## 2016-05-27 VITALS — BP 135/79 | HR 86 | Temp 98.2°F | Resp 16 | Ht 62.5 in | Wt 149.0 lb

## 2016-05-27 DIAGNOSIS — I739 Peripheral vascular disease, unspecified: Secondary | ICD-10-CM

## 2016-05-27 DIAGNOSIS — Z48812 Encounter for surgical aftercare following surgery on the circulatory system: Secondary | ICD-10-CM

## 2016-05-27 NOTE — Progress Notes (Signed)
HISTORY AND PHYSICAL     CC:  Follow up Referring Provider:  Kirstie PeriShah, Ashish, MD  HPI: This is a 70 y.o. female who has had an aortobifemoral bypass graft in 1999 by Dr. Arbie CookeyEarly for aorto-occlusive disease.  She states that she is doing quite well.  She denies any claudication sx or non healing wounds.  Since seeing Dr. Arbie CookeyEarly in 1999, she underwent CABG by Dr. Dorris FetchHendrickson in 2000.  She has had PCI in March 2010 with DES to the LCx.    Her carotid disease is being followed by cardiology and her last duplex in July 2017 revealed 1-39% bilaterally.    She is on a statin for cholesterol management.  She is on a CCB, ARB/HCTZ, ACEI for blood pressure control.  She is on Plavix.  She takes a daily aspirin.    Past Medical History:  Diagnosis Date  . Anxiety and depression   . CAD (coronary artery disease)    PCI, March, 2010  /   nuclear, July, 2010, no scar or ischemia  . Carotid artery disease (HCC)    Doppler November, 2011, mild atherosclerotic disease, less than 50% stenosis  LICA,  no significant stenosis or ICA,, vertebral is antegrade  . Dyslipidemia   . GERD (gastroesophageal reflux disease)   . Hx of CABG    2000  . Hx of carpal tunnel syndrome   . Hypertension   . PAD (peripheral artery disease) (HCC)    Aortobifem bypass, January, 1999  . Schatzki's ring    Rourk  . Shortness of breath     Past Surgical History:  Procedure Laterality Date  . AORTOBIFEMORAL BYPASS GRAFTING  1999  . CORONARY ARTERY BYPASS GRAFT  2000  . CYST REMOVAL FROM BREAST     LEFT  . HEMORRHOID SURGERY    . HERNIA REPAIR    . TUBAL LIGATION      Allergies  Allergen Reactions  . Penicillins     Current Outpatient Prescriptions  Medication Sig Dispense Refill  . amLODipine (NORVASC) 5 MG tablet Take 5 mg by mouth daily.  6  . aspirin 81 MG tablet Take 81 mg by mouth daily.    Marland Kitchen. buPROPion (WELLBUTRIN XL) 150 MG 24 hr tablet Take 150 mg by mouth daily.    . calcium carbonate (OS-CAL) 600 MG  tablet Take 600 mg by mouth daily.    . clonazePAM (KLONOPIN) 0.5 MG tablet Take 1 mg by mouth at bedtime.    . clopidogrel (PLAVIX) 75 MG tablet Take 1 tablet (75 mg total) by mouth daily. 30 tablet 3  . lisinopril (PRINIVIL,ZESTRIL) 40 MG tablet Take 1 tablet (40 mg total) by mouth daily. 30 tablet 6  . losartan-hydrochlorothiazide (HYZAAR) 50-12.5 MG tablet Take 1 tablet by mouth daily.  3  . MULTIPLE VITAMIN PO Take 1 tablet by mouth daily.    . nitroGLYCERIN (NITROSTAT) 0.4 MG SL tablet Place 0.4 mg under the tongue every 5 (five) minutes as needed.    . pravastatin (PRAVACHOL) 40 MG tablet Take 40 mg by mouth daily.  4  . ranitidine (ZANTAC) 150 MG capsule Take 150 mg by mouth 2 (two) times daily.     No current facility-administered medications for this visit.     Family History  Problem Relation Age of Onset  . Heart attack Sister     deceased  . Heart attack Brother 1338    deceased    Social History   Social History  . Marital  status: Widowed    Spouse name: N/A  . Number of children: 4  . Years of education: N/A   Occupational History  . UNEMPLOYED    Social History Main Topics  . Smoking status: Former Smoker    Packs/day: 1.00    Types: Cigarettes    Quit date: 07/28/1997  . Smokeless tobacco: Never Used     Comment: Year Quit: 1999  . Alcohol use No  . Drug use: No  . Sexual activity: Not on file   Other Topics Concern  . Not on file   Social History Narrative  . No narrative on file     REVIEW OF SYSTEMS:   [X]  denotes positive finding, [ ]  denotes negative finding Cardiac  Comments:  Chest pain or chest pressure:    Shortness of breath upon exertion:    Short of breath when lying flat:    Irregular heart rhythm:        Vascular    Pain in calf, thigh, or hip brought on by ambulation:    Pain in feet at night that wakes you up from your sleep:     Blood clot in your veins:    Leg swelling:         Pulmonary    Oxygen at home:      Productive cough:     Wheezing:         Neurologic    Sudden weakness in arms or legs:     Sudden numbness in arms or legs:     Sudden onset of difficulty speaking or slurred speech:    Temporary loss of vision in one eye:     Problems with dizziness:         Gastrointestinal    Blood in stool:     Vomited blood:         Genitourinary    Burning when urinating:     Blood in urine:        Psychiatric    Major depression:         Hematologic    Bleeding problems:    Problems with blood clotting too easily:        Skin    Rashes or ulcers:        Constitutional    Fever or chills:      PHYSICAL EXAMINATION:  Vitals:   05/27/16 1332  BP: 135/79  Pulse: 86  Resp: 16  Temp: 98.2 F (36.8 C)   Body mass index is 26.82 kg/m.  General:  WDWN in NAD; vital signs documented above Gait: Not observed HENT: WNL, normocephalic Pulmonary: normal non-labored breathing , without Rales, rhonchi,  wheezing Cardiac: regular HR, without  Murmurs, rubs or gallops; without carotid bruits Abdomen: soft, NT, no masses Skin: without rashes Vascular Exam/Pulses:  Right Left  Radial 2+ (normal) 2+ (normal)  Femoral 2+ (normal) 2+ (normal)  Popliteal Unable to palpate  Unable to palpate   DP 2+ (normal) 2+ (normal)  PT Unable to palpate  Unable to palpate    Extremities: without ischemic changes, without Gangrene , without cellulitis; without open wounds;  Musculoskeletal: no muscle wasting or atrophy  Neurologic: A&O X 3;  No focal weakness or paresthesias are detected Psychiatric:  The pt has Normal affect.   Non-Invasive Vascular Imaging:   ABI's 05/27/16: Right 1.03 Left:  1.10  Pt meds includes: Statin:  Yes.   Beta Blocker:  No. Aspirin:  Yes.   ACEI:  Yes.  ARB:  Yes.   Other Antiplatelet/Anticoagulant:  Yes.   Plavix   ASSESSMENT/PLAN:: 70 y.o. female with hx of aortobifemoral bypass grafting in 1999 by Dr. Arbie Cookey   -pt is doing well without any  peripheral vascular issues without any claudication and palpable DP pulses bilaterally and normal ABI's -if she starts to develop sx similar to the ones she had prior to her aortobifem, she will contact us, otherwise, she will f/u as needed.  -smoking cessation-she quit in 1999 -continue statin/aspirin   Doreatha Massed, PA-C [facility] [phone]  Clinic MD:  Pt seen and examined in conjunction with Dr. Arbie Cookey  I have examined the patient, reviewed and agree with above. Patient well known to me from prior aortofemoral bypass grafting for lower from the arterial insufficiency in 1999.She has had excellent long-term result 18 years later with patent bypass and no symptoms. Discussed symptoms of recurrent occlusive disease and she'll notify this is should this occur. Otherwise we'll see her on an as-needed basis  Gretta Began, MD 05/27/2016 2:32 PM

## 2016-08-05 ENCOUNTER — Encounter: Payer: Self-pay | Admitting: *Deleted

## 2016-08-06 ENCOUNTER — Encounter: Payer: Self-pay | Admitting: Cardiology

## 2016-08-06 ENCOUNTER — Ambulatory Visit (INDEPENDENT_AMBULATORY_CARE_PROVIDER_SITE_OTHER): Payer: Medicare HMO | Admitting: Cardiology

## 2016-08-06 VITALS — BP 127/81 | HR 89 | Ht 63.0 in | Wt 146.6 lb

## 2016-08-06 DIAGNOSIS — E782 Mixed hyperlipidemia: Secondary | ICD-10-CM | POA: Diagnosis not present

## 2016-08-06 DIAGNOSIS — I739 Peripheral vascular disease, unspecified: Secondary | ICD-10-CM

## 2016-08-06 DIAGNOSIS — I6523 Occlusion and stenosis of bilateral carotid arteries: Secondary | ICD-10-CM | POA: Diagnosis not present

## 2016-08-06 DIAGNOSIS — I2510 Atherosclerotic heart disease of native coronary artery without angina pectoris: Secondary | ICD-10-CM

## 2016-08-06 DIAGNOSIS — I10 Essential (primary) hypertension: Secondary | ICD-10-CM

## 2016-08-06 NOTE — Patient Instructions (Signed)
Your physician wants you to follow-up in: 1 YEAR WITH DR. BRANCH You will receive a reminder letter in the mail two months in advance. If you don't receive a letter, please call our office to schedule the follow-up appointment.  Your physician has recommended you make the following change in your medication:   STOP PLAVIX   PLEASE CALL OUR OFFICE SO THAT WE MAY VERIFY YOUR MEDICATIONS   Thank you for choosing [facility]!!

## 2016-08-06 NOTE — Progress Notes (Signed)
Clinical Summary Ms. Milta DeitersSlaughter is a 71 y.o.female seen today for follow up of the following medical problems.   1. CAD - previous PCI 09/2008 for UA, received DES to LCX - history of prior CABG in [facility] at [facility].  - 09/2015 [place] nuclear stress: no ischemia, LVEF 69% - compliant with meds. She has been ASA/plavix since 2010.   - no recent chest pain. No SOB or DOE   2. Hyperlipidemia - muscle aches on lipitor,she is on pravastatin and tolerating well  3. Carotid stenosis - mild bilateral disease 2011  - carotid US 01/2016 with mild bilateral disease.  - no recent neuro symptoms.   4. PAD - 1999 had aortobifem bypass. Followed by vascular with appt 04/2016, recs to f/u only as needed.    Past Medical History:  Diagnosis Date  . Anxiety and depression   . CAD (coronary artery disease)    PCI, March, 2010  /   nuclear, July, 2010, no scar or ischemia  . Carotid artery disease (HCC)    Doppler November, 2011, mild atherosclerotic disease, less than 50% stenosis  LICA,  no significant stenosis or ICA,, vertebral is antegrade  . Dyslipidemia   . GERD (gastroesophageal reflux disease)   . Hx of CABG    2000  . Hx of carpal tunnel syndrome   . Hypertension   . PAD (peripheral artery disease) (HCC)    Aortobifem bypass, January, 1999  . Schatzki's ring    Rourk  . Shortness of breath      Allergies  Allergen Reactions  . Penicillins      Current Outpatient Prescriptions  Medication Sig Dispense Refill  . amLODipine (NORVASC) 5 MG tablet Take 5 mg by mouth daily.  6  . aspirin 81 MG tablet Take 81 mg by mouth daily.    Marland Kitchen. buPROPion (WELLBUTRIN XL) 150 MG 24 hr tablet Take 150 mg by mouth daily.    . calcium carbonate (OS-CAL) 600 MG tablet Take 600 mg by mouth daily.    . clonazePAM (KLONOPIN) 0.5 MG tablet Take 1 mg by mouth at bedtime.    . clopidogrel (PLAVIX) 75 MG tablet Take 1 tablet (75 mg total) by mouth daily. 30 tablet 3  . lisinopril  (PRINIVIL,ZESTRIL) 40 MG tablet Take 1 tablet (40 mg total) by mouth daily. 30 tablet 6  . losartan-hydrochlorothiazide (HYZAAR) 50-12.5 MG tablet Take 1 tablet by mouth daily.  3  . MULTIPLE VITAMIN PO Take 1 tablet by mouth daily.    . nitroGLYCERIN (NITROSTAT) 0.4 MG SL tablet Place 0.4 mg under the tongue every 5 (five) minutes as needed.    . pravastatin (PRAVACHOL) 40 MG tablet Take 40 mg by mouth daily.  4  . ranitidine (ZANTAC) 150 MG capsule Take 150 mg by mouth 2 (two) times daily.     No current facility-administered medications for this visit.      Past Surgical History:  Procedure Laterality Date  . AORTOBIFEMORAL BYPASS GRAFTING  1999  . CORONARY ARTERY BYPASS GRAFT  2000  . CYST REMOVAL FROM BREAST     LEFT  . HEMORRHOID SURGERY    . HERNIA REPAIR    . TUBAL LIGATION       Allergies  Allergen Reactions  . Penicillins       Family History  Problem Relation Age of Onset  . Heart attack Sister     deceased  . Heart attack Brother 4738    deceased  Social History Ms. Bulson reports that she quit smoking about 19 years ago. Her smoking use included Cigarettes. She smoked 1.00 pack per day. She has never used smokeless tobacco. Ms. Acoff reports that she does not drink alcohol.   Review of Systems CONSTITUTIONAL: No weight loss, fever, chills, weakness or fatigue.  HEENT: Eyes: No visual loss, blurred vision, double vision or yellow sclerae.No hearing loss, sneezing, congestion, runny nose or sore throat.  SKIN: No rash or itching.  CARDIOVASCULAR: per HPI RESPIRATORY: No shortness of breath, cough or sputum.  GASTROINTESTINAL: No anorexia, nausea, vomiting or diarrhea. No abdominal pain or blood.  GENITOURINARY: No burning on urination, no polyuria NEUROLOGICAL: No headache, dizziness, syncope, paralysis, ataxia, numbness or tingling in the extremities. No change in bowel or bladder control.  MUSCULOSKELETAL: No muscle, back pain, joint pain or  stiffness.  LYMPHATICS: No enlarged nodes. No history of splenectomy.  PSYCHIATRIC: No history of depression or anxiety.  ENDOCRINOLOGIC: No reports of sweating, cold or heat intolerance. No polyuria or polydipsia.  Marland Kitchen   Physical Examination Vitals:   08/06/16 1515  BP: 127/81  Pulse: 89   Vitals:   08/06/16 1515  Weight: 146 lb 9.6 oz (66.5 kg)  Height: 5\' 3"  (1.6 m)    Gen: resting comfortably, no acute distress HEENT: no scleral icterus, pupils equal round and reactive, no palptable cervical adenopathy,  CV: RRR, no m/r/g, no jvd Resp: Clear to auscultation bilaterally GI: abdomen is soft, non-tender, non-distended, normal bowel sounds, no hepatosplenomegaly MSK: extremities are warm, no edema.  Skin: warm, no rash Neuro:  no focal deficits Psych: appropriate affect   Diagnostic Studies 09/2008 Cath RESULTS: Left main coronary artery: The left main coronary artery was free of significant disease.  Left anterior descending artery: The left anterior ascending artery had 90% stenosis located just after the takeoff of the first septal perforator and first diagonal Achillies Buehl. There was competing flow in the first diagonal Nadir Vasques and there was 90% ostial stenosis in the first diagonal Binyamin Nelis.  Circumflex artery: The circumflex artery gave rise to an atrial Jaimie Pippins and then had a 99% stenosis feeding a marginal Perlita Forbush.  Right coronary artery: The right coronary artery had some irregularities in its midportion. The posterior descending Aniylah Avans appeared to have a subbranch that was totally occluded, although it appeared likely that this was a small vessel.  The saphenous vein graft to the right coronary artery was completely occluded at its origin.  The LIMA graft to the LAD was atretic and possibly completely occluded at its midportion. It certainly was functionally occluded.   09/2008 Cath Invertention RESULTS: Initially stenosis in the mid  circumflex artery was 99% with TIMI II flow. Following stenting, this improved to 0% with TIMI III flow.  CONCLUSION: Successful PCI of the chronic total occlusion of the mid circumflex artery using a XIENCE drug-eluting stent with improvement in center narrowing from 99% to 0% and improvement of flow from TIMI II to TIMI III flow.  DISPOSITION: The patient returned to post angio room for further observation.     Assessment and Plan  1. CAD - no recent symptoms. Recent negative stress test - stop plavix, no indication for ongoing DAPT   2. Carotid stenosis - mild bilateral disease - continue to monitor  3. PAD - no recent symptoms - f/u with vascular as needed.   4. Hyperlipidemia - did not tolerate high dose statin, doing well on pravastatin - continue current meds - request labs from pcp  5. HTN - confusion about her medication regimen. She has both lisionpril and hyzaar listed, she is not sure if she is taking metoprolol - she will call us to verify her home regimen    Antoine Poche, M.D.

## 2017-08-11 ENCOUNTER — Ambulatory Visit: Payer: Medicare HMO | Admitting: Cardiology

## 2017-09-02 ENCOUNTER — Ambulatory Visit: Payer: Medicare HMO | Admitting: Cardiology

## 2017-09-21 ENCOUNTER — Encounter: Payer: Self-pay | Admitting: *Deleted

## 2017-09-22 ENCOUNTER — Encounter: Payer: Self-pay | Admitting: Cardiology

## 2017-09-22 ENCOUNTER — Other Ambulatory Visit: Payer: Self-pay

## 2017-09-22 ENCOUNTER — Ambulatory Visit: Payer: Medicare HMO | Admitting: Cardiology

## 2017-09-22 VITALS — BP 133/84 | HR 96 | Ht 63.0 in | Wt 143.0 lb

## 2017-09-22 DIAGNOSIS — I10 Essential (primary) hypertension: Secondary | ICD-10-CM

## 2017-09-22 DIAGNOSIS — E782 Mixed hyperlipidemia: Secondary | ICD-10-CM

## 2017-09-22 DIAGNOSIS — I6523 Occlusion and stenosis of bilateral carotid arteries: Secondary | ICD-10-CM

## 2017-09-22 DIAGNOSIS — I2510 Atherosclerotic heart disease of native coronary artery without angina pectoris: Secondary | ICD-10-CM

## 2017-09-22 DIAGNOSIS — I739 Peripheral vascular disease, unspecified: Secondary | ICD-10-CM

## 2017-09-22 MED ORDER — NITROGLYCERIN 0.4 MG SL SUBL: 0.4000 mg | SUBLINGUAL_TABLET | SUBLINGUAL | 3 refills | Status: DC | PRN

## 2017-09-22 NOTE — Progress Notes (Signed)
Clinical Summary Cathy Sharp is a 72 y.o.female seen today for follow up of the following medical problems.   1. CAD - previous PCI 09/2008 for UA, received DES to LCX - history of prior CABG in 2000 at [facility].  - 09/2015 [place] nuclear stress: no ischemia, LVEF 69% - compliant with meds. She has been ASA/plavix since 2010.    - no chest pan. Occasional SOB/DOE that is new. She thinks this is related to deconditioning.  - compliant with meds  2. Hyperlipidemia - muscle aches on lipitor,she is on pravastatin and tolerating well - remains compliant with meds  3. Carotid stenosis - mild bilateral disease 2011  - carotid US 01/2016 with mild bilateral disease.  - denies any recent symptoms.   4. PAD - 1999 had aortobifem bypass. Followed by vascular with appt 04/2016, recs to f/u only as needed.   - denies any recent claudication  Past Medical History:  Diagnosis Date  . Anxiety and depression   . CAD (coronary artery disease)    PCI, March, 2010  /   nuclear, July, 2010, no scar or ischemia  . Carotid artery disease (HCC)    Doppler November, 2011, mild atherosclerotic disease, less than 50% stenosis  LICA,  no significant stenosis or ICA,, vertebral is antegrade  . Dyslipidemia   . GERD (gastroesophageal reflux disease)   . Hx of CABG    2000  . Hx of carpal tunnel syndrome   . Hypertension   . PAD (peripheral artery disease) (HCC)    Aortobifem bypass, January, 1999  . Schatzki's ring    Rourk  . Shortness of breath      Allergies  Allergen Reactions  . Penicillins      Current Outpatient Medications  Medication Sig Dispense Refill  . amLODipine (NORVASC) 5 MG tablet Take 5 mg by mouth daily.  6  . aspirin 81 MG tablet Take 81 mg by mouth daily.    Marland Kitchen. buPROPion (WELLBUTRIN XL) 150 MG 24 hr tablet Take 150 mg by mouth daily.    . calcium carbonate (OS-CAL) 600 MG tablet Take 600 mg by mouth daily.    . clonazePAM (KLONOPIN) 0.5 MG tablet  Take 1 mg by mouth. 2 tabs at bedtime    . losartan-hydrochlorothiazide (HYZAAR) 50-12.5 MG tablet Take 1 tablet by mouth daily.  3  . MULTIPLE VITAMIN PO Take 1 tablet by mouth daily.    . nitroGLYCERIN (NITROSTAT) 0.4 MG SL tablet Place 0.4 mg under the tongue every 5 (five) minutes as needed.    . pravastatin (PRAVACHOL) 40 MG tablet Take 40 mg by mouth daily.  4  . ranitidine (ZANTAC) 150 MG capsule Take 150 mg by mouth 2 (two) times daily.     No current facility-administered medications for this visit.      Past Surgical History:  Procedure Laterality Date  . AORTOBIFEMORAL BYPASS GRAFTING  1999  . CORONARY ARTERY BYPASS GRAFT  2000  . CYST REMOVAL FROM BREAST     LEFT  . HEMORRHOID SURGERY    . HERNIA REPAIR    . TUBAL LIGATION       Allergies  Allergen Reactions  . Penicillins       Family History  Problem Relation Age of Onset  . Heart attack Sister        deceased  . Heart attack Brother [id number]       deceased     Social History Ms. Piet reports  that she quit smoking about 20 years ago. Her smoking use included cigarettes. She smoked 1.00 pack per day. she has never used smokeless tobacco. Ms. Shima reports that she does not drink alcohol.   Review of Systems CONSTITUTIONAL: No weight loss, fever, chills, weakness or fatigue.  HEENT: Eyes: No visual loss, blurred vision, double vision or yellow sclerae.No hearing loss, sneezing, congestion, runny nose or sore throat.  SKIN: No rash or itching.  CARDIOVASCULAR: per hpi RESPIRATORY: per hpi GASTROINTESTINAL: No anorexia, nausea, vomiting or diarrhea. No abdominal pain or blood.  GENITOURINARY: No burning on urination, no polyuria NEUROLOGICAL: No headache, dizziness, syncope, paralysis, ataxia, numbness or tingling in the extremities. No change in bowel or bladder control.  MUSCULOSKELETAL: No muscle, back pain, joint pain or stiffness.  LYMPHATICS: No enlarged nodes. No history of splenectomy.    PSYCHIATRIC: No history of depression or anxiety.  ENDOCRINOLOGIC: No reports of sweating, cold or heat intolerance. No polyuria or polydipsia.  Marland Kitchen   Physical Examination Vitals:   09/22/17 1612  BP: 133/84  Pulse: 96  SpO2: 97%   Vitals:   09/22/17 1612  Weight: 143 lb (64.9 kg)  Height: 5\' 3"  (1.6 m)    Gen: resting comfortably, no acute distress HEENT: no scleral icterus, pupils equal round and reactive, no palptable cervical adenopathy,  CV: RRR, no m/r/g, no jvd Resp: Clear to auscultation bilaterally GI: abdomen is soft, non-tender, non-distended, normal bowel sounds, no hepatosplenomegaly MSK: extremities are warm, no edema.  Skin: warm, no rash Neuro:  no focal deficits Psych: appropriate affect   Diagnostic Studies  09/2008 Cath RESULTS: Left main coronary artery: The left main coronary artery was free of significant disease.  Left anterior descending artery: The left anterior ascending artery had 90% stenosis located just after the takeoff of the first septal perforator and first diagonal Sabriah Hobbins. There was competing flow in the first diagonal Persia Lintner and there was 90% ostial stenosis in the first diagonal Ajiah Mcglinn.  Circumflex artery: The circumflex artery gave rise to an atrial Laynie Espy and then had a 99% stenosis feeding a marginal Lolah Coghlan.  Right coronary artery: The right coronary artery had some irregularities in its midportion. The posterior descending Blandon Offerdahl appeared to have a subbranch that was totally occluded, although it appeared likely that this was a small vessel.  The saphenous vein graft to the right coronary artery was completely occluded at its origin.  The LIMA graft to the LAD was atretic and possibly completely occluded at its midportion. It certainly was functionally occluded.   09/2008 Cath Invertention RESULTS: Initially stenosis in the mid circumflex artery was 99% with TIMI II flow. Following  stenting, this improved to 0% with TIMI III flow.  CONCLUSION: Successful PCI of the chronic total occlusion of the mid circumflex artery using a XIENCE drug-eluting stent with improvement in center narrowing from 99% to 0% and improvement of flow from TIMI II to TIMI III flow.  DISPOSITION: The patient returned to post angio room for further observation.    Assessment and Plan  1. CAD - no recent symptoms. Recent negative stress test - we will continue current meds   2. Carotid stenosis - mild bilateral disease - no symptoms. Continue to monitor at this time.   3. PAD - no recent claudication symptoms - f/u with vascular as needed.   4. Hyperlipidemia - did not tolerate high dose statin, doing well on pravastatin - continue current therapy.   5. HTN - bp overall at goal, continue  current meds   F/u 6 months    Antoine Poche, M.D.,

## 2017-09-22 NOTE — Patient Instructions (Signed)

## 2017-09-23 ENCOUNTER — Encounter: Payer: Self-pay | Admitting: *Deleted

## 2017-09-26 ENCOUNTER — Encounter: Payer: Self-pay | Admitting: Cardiology

## 2017-12-28 DIAGNOSIS — F419 Anxiety disorder, unspecified: Secondary | ICD-10-CM | POA: Diagnosis not present

## 2017-12-28 DIAGNOSIS — I739 Peripheral vascular disease, unspecified: Secondary | ICD-10-CM | POA: Diagnosis not present

## 2017-12-28 DIAGNOSIS — Z299 Encounter for prophylactic measures, unspecified: Secondary | ICD-10-CM | POA: Diagnosis not present

## 2017-12-28 DIAGNOSIS — Z6829 Body mass index (BMI) 29.0-29.9, adult: Secondary | ICD-10-CM | POA: Diagnosis not present

## 2017-12-28 DIAGNOSIS — I10 Essential (primary) hypertension: Secondary | ICD-10-CM | POA: Diagnosis not present

## 2018-02-01 DIAGNOSIS — I2510 Atherosclerotic heart disease of native coronary artery without angina pectoris: Secondary | ICD-10-CM | POA: Diagnosis not present

## 2018-02-01 DIAGNOSIS — I10 Essential (primary) hypertension: Secondary | ICD-10-CM | POA: Diagnosis not present

## 2018-02-01 DIAGNOSIS — E7800 Pure hypercholesterolemia, unspecified: Secondary | ICD-10-CM | POA: Diagnosis not present

## 2018-03-31 ENCOUNTER — Encounter: Payer: Self-pay | Admitting: Cardiology

## 2018-03-31 ENCOUNTER — Ambulatory Visit: Payer: Medicare HMO | Admitting: Cardiology

## 2018-03-31 VITALS — BP 136/77 | HR 96 | Ht 63.0 in | Wt 150.4 lb

## 2018-03-31 DIAGNOSIS — E782 Mixed hyperlipidemia: Secondary | ICD-10-CM

## 2018-03-31 DIAGNOSIS — I10 Essential (primary) hypertension: Secondary | ICD-10-CM

## 2018-03-31 DIAGNOSIS — I2510 Atherosclerotic heart disease of native coronary artery without angina pectoris: Secondary | ICD-10-CM

## 2018-03-31 DIAGNOSIS — I739 Peripheral vascular disease, unspecified: Secondary | ICD-10-CM

## 2018-03-31 NOTE — Progress Notes (Signed)
Clinical Summary Cathy Sharp is a 72 y.o.female seen today for follow up of the following medical problems.  1. CAD - previous PCI 09/2008 for UA, received DES to LCX - history of prior CABG in 2000 at [facility]. - 09/2015 [place] nuclear stress: no ischemia, LVEF 69% -  She has been ASA/plavix since 2010.     - no recent chest pain/SOB/DOE - compliant with meds   2. Hyperlipidemia - muscle aches on lipitor,she ison pravastatin and tolerating well - upcoming labs with pcp. 03/2017 LDL 74   3. Carotid stenosis - mild bilateral disease 2011 - carotid US 01/2016 with mild bilateral disease.  - no recent neuro symptoms.   4. PAD - 1999 had aortobifem bypass. Followed by vascular with appt 04/2016, recs to f/u only as needed.  - no recent claudication symptoms.     SH: travels with granddaughter for softball games.  Past Medical History:  Diagnosis Date  . Anxiety and depression   . CAD (coronary artery disease)    PCI, March, 2010  /   nuclear, July, 2010, no scar or ischemia  . Carotid artery disease (HCC)    Doppler November, 2011, mild atherosclerotic disease, less than 50% stenosis  LICA,  no significant stenosis or ICA,, vertebral is antegrade  . Dyslipidemia   . GERD (gastroesophageal reflux disease)   . Hx of CABG    2000  . Hx of carpal tunnel syndrome   . Hypertension   . PAD (peripheral artery disease) (HCC)    Aortobifem bypass, January, 1999  . Schatzki's ring    Rourk  . Shortness of breath      Allergies  Allergen Reactions  . Penicillins      Current Outpatient Medications  Medication Sig Dispense Refill  . amLODipine (NORVASC) 5 MG tablet Take 5 mg by mouth daily.  6  . aspirin 81 MG tablet Take 81 mg by mouth daily.    Marland Kitchen buPROPion (WELLBUTRIN XL) 150 MG 24 hr tablet Take 150 mg by mouth daily.    . calcium carbonate (OS-CAL) 600 MG tablet Take 600 mg by mouth daily.    . Cholecalciferol (VITAMIN D3) 1000 units CAPS  Take by mouth.    . clonazePAM (KLONOPIN) 0.5 MG tablet Take 1 mg by mouth at bedtime.     Marland Kitchen losartan-hydrochlorothiazide (HYZAAR) 50-12.5 MG tablet Take 1 tablet by mouth daily.  3  . nitroGLYCERIN (NITROSTAT) 0.4 MG SL tablet Place 1 tablet (0.4 mg total) under the tongue every 5 (five) minutes as needed. 25 tablet 3  . pravastatin (PRAVACHOL) 40 MG tablet Take 40 mg by mouth daily.  4  . ranitidine (ZANTAC) 150 MG capsule Take 150 mg by mouth 2 (two) times daily.     No current facility-administered medications for this visit.      Past Surgical History:  Procedure Laterality Date  . AORTOBIFEMORAL BYPASS GRAFTING  1999  . CORONARY ARTERY BYPASS GRAFT  2000  . CYST REMOVAL FROM BREAST     LEFT  . HEMORRHOID SURGERY    . HERNIA REPAIR    . TUBAL LIGATION       Allergies  Allergen Reactions  . Penicillins       Family History  Problem Relation Age of Onset  . Heart attack Sister        deceased  . Heart attack Brother 76       deceased     Social History Ms. Tomeo  reports that she quit smoking about 20 years ago. Her smoking use included cigarettes. She smoked 1.00 pack per day. She has never used smokeless tobacco. Ms. Willock reports that she does not drink alcohol.   Review of Systems CONSTITUTIONAL: No weight loss, fever, chills, weakness or fatigue.  HEENT: Eyes: No visual loss, blurred vision, double vision or yellow sclerae.No hearing loss, sneezing, congestion, runny nose or sore throat.  SKIN: No rash or itching.  CARDIOVASCULAR: per hpi RESPIRATORY: No shortness of breath, cough or sputum.  GASTROINTESTINAL: No anorexia, nausea, vomiting or diarrhea. No abdominal pain or blood.  GENITOURINARY: No burning on urination, no polyuria NEUROLOGICAL: No headache, dizziness, syncope, paralysis, ataxia, numbness or tingling in the extremities. No change in bowel or bladder control.  MUSCULOSKELETAL: No muscle, back pain, joint pain or stiffness.    LYMPHATICS: No enlarged nodes. No history of splenectomy.  PSYCHIATRIC: No history of depression or anxiety.  ENDOCRINOLOGIC: No reports of sweating, cold or heat intolerance. No polyuria or polydipsia.  Marland Kitchen   Physical Examination Vitals:   03/31/18 1315  BP: 136/77  Pulse: 96   Filed Weights   03/31/18 1315  Weight: 150 lb 6.4 oz (68.2 kg)    Gen: resting comfortably, no acute distress HEENT: no scleral icterus, pupils equal round and reactive, no palptable cervical adenopathy,  CV: RRR, no m/r/g no jvd Resp: Clear to auscultation bilaterally GI: abdomen is soft, non-tender, non-distended, normal bowel sounds, no hepatosplenomegaly MSK: extremities are warm, no edema.  Skin: warm, no rash Neuro:  no focal deficits Psych: appropriate affect   Diagnostic Studies  09/2008 Cath RESULTS: Left main coronary artery: The left main coronary artery was free of significant disease.  Left anterior descending artery: The left anterior ascending artery had 90% stenosis located just after the takeoff of the first septal perforator and first diagonal Hildred Mollica. There was competing flow in the first diagonal Breydon Senters and there was 90% ostial stenosis in the first diagonal Stefhanie Kachmar.  Circumflex artery: The circumflex artery gave rise to an atrial Mertha Clyatt and then had a 99% stenosis feeding a marginal Fount Bahe.  Right coronary artery: The right coronary artery had some irregularities in its midportion. The posterior descending Vada Yellen appeared to have a subbranch that was totally occluded, although it appeared likely that this was a small vessel.  The saphenous vein graft to the right coronary artery was completely occluded at its origin.  The LIMA graft to the LAD was atretic and possibly completely occluded at its midportion. It certainly was functionally occluded.   09/2008 Cath Invertention RESULTS: Initially stenosis in the mid circumflex artery was 99%  with TIMI II flow. Following stenting, this improved to 0% with TIMI III flow.  CONCLUSION: Successful PCI of the chronic total occlusion of the mid circumflex artery using a XIENCE drug-eluting stent with improvement in center narrowing from 99% to 0% and improvement of flow from TIMI II to TIMI III flow.  DISPOSITION: The patient returned to post angio room for further observation.     Assessment and Plan  1. CAD -no symptoms, continue current medical therapy.    2. Carotid stenosis -mild bilateral disease - continue medical therapy.   3. PAD -no recent symptoms, continue medical therapy.   4. Hyperlipidemia - did not tolerate high dose statin, doing well on pravastatin -continue statin  5. HTN - overall at goal, continue current meds   F/u 1 year      Antoine Poche, M.D.

## 2018-03-31 NOTE — Patient Instructions (Signed)

## 2018-04-08 DIAGNOSIS — I10 Essential (primary) hypertension: Secondary | ICD-10-CM | POA: Diagnosis not present

## 2018-04-08 DIAGNOSIS — Z299 Encounter for prophylactic measures, unspecified: Secondary | ICD-10-CM | POA: Diagnosis not present

## 2018-04-08 DIAGNOSIS — F419 Anxiety disorder, unspecified: Secondary | ICD-10-CM | POA: Diagnosis not present

## 2018-04-08 DIAGNOSIS — I739 Peripheral vascular disease, unspecified: Secondary | ICD-10-CM | POA: Diagnosis not present

## 2018-04-08 DIAGNOSIS — Z6828 Body mass index (BMI) 28.0-28.9, adult: Secondary | ICD-10-CM | POA: Diagnosis not present

## 2018-04-26 DIAGNOSIS — Z1331 Encounter for screening for depression: Secondary | ICD-10-CM | POA: Diagnosis not present

## 2018-04-26 DIAGNOSIS — E559 Vitamin D deficiency, unspecified: Secondary | ICD-10-CM | POA: Diagnosis not present

## 2018-04-26 DIAGNOSIS — Z0000 Encounter for general adult medical examination without abnormal findings: Secondary | ICD-10-CM | POA: Diagnosis not present

## 2018-04-26 DIAGNOSIS — Z7189 Other specified counseling: Secondary | ICD-10-CM | POA: Diagnosis not present

## 2018-04-26 DIAGNOSIS — F419 Anxiety disorder, unspecified: Secondary | ICD-10-CM | POA: Diagnosis not present

## 2018-04-26 DIAGNOSIS — Z79899 Other long term (current) drug therapy: Secondary | ICD-10-CM | POA: Diagnosis not present

## 2018-04-26 DIAGNOSIS — R5383 Other fatigue: Secondary | ICD-10-CM | POA: Diagnosis not present

## 2018-04-26 DIAGNOSIS — E7800 Pure hypercholesterolemia, unspecified: Secondary | ICD-10-CM | POA: Diagnosis not present

## 2018-04-26 DIAGNOSIS — Z1339 Encounter for screening examination for other mental health and behavioral disorders: Secondary | ICD-10-CM | POA: Diagnosis not present

## 2018-04-26 DIAGNOSIS — Z299 Encounter for prophylactic measures, unspecified: Secondary | ICD-10-CM | POA: Diagnosis not present

## 2018-04-26 DIAGNOSIS — Z6828 Body mass index (BMI) 28.0-28.9, adult: Secondary | ICD-10-CM | POA: Diagnosis not present

## 2018-04-26 DIAGNOSIS — Z23 Encounter for immunization: Secondary | ICD-10-CM | POA: Diagnosis not present

## 2018-04-26 DIAGNOSIS — Z1211 Encounter for screening for malignant neoplasm of colon: Secondary | ICD-10-CM | POA: Diagnosis not present

## 2018-06-08 DIAGNOSIS — Z1231 Encounter for screening mammogram for malignant neoplasm of breast: Secondary | ICD-10-CM | POA: Diagnosis not present

## 2018-06-28 DIAGNOSIS — I739 Peripheral vascular disease, unspecified: Secondary | ICD-10-CM | POA: Diagnosis not present

## 2018-06-28 DIAGNOSIS — Z6828 Body mass index (BMI) 28.0-28.9, adult: Secondary | ICD-10-CM | POA: Diagnosis not present

## 2018-06-28 DIAGNOSIS — Z299 Encounter for prophylactic measures, unspecified: Secondary | ICD-10-CM | POA: Diagnosis not present

## 2018-06-28 DIAGNOSIS — I10 Essential (primary) hypertension: Secondary | ICD-10-CM | POA: Diagnosis not present

## 2018-09-13 DIAGNOSIS — E2839 Other primary ovarian failure: Secondary | ICD-10-CM | POA: Diagnosis not present

## 2018-09-28 DIAGNOSIS — K219 Gastro-esophageal reflux disease without esophagitis: Secondary | ICD-10-CM | POA: Diagnosis not present

## 2018-09-28 DIAGNOSIS — Z87891 Personal history of nicotine dependence: Secondary | ICD-10-CM | POA: Diagnosis not present

## 2018-09-28 DIAGNOSIS — I739 Peripheral vascular disease, unspecified: Secondary | ICD-10-CM | POA: Diagnosis not present

## 2018-09-28 DIAGNOSIS — Z79899 Other long term (current) drug therapy: Secondary | ICD-10-CM | POA: Diagnosis not present

## 2018-09-28 DIAGNOSIS — I10 Essential (primary) hypertension: Secondary | ICD-10-CM | POA: Diagnosis not present

## 2018-09-28 DIAGNOSIS — Z6828 Body mass index (BMI) 28.0-28.9, adult: Secondary | ICD-10-CM | POA: Diagnosis not present

## 2018-09-28 DIAGNOSIS — Z299 Encounter for prophylactic measures, unspecified: Secondary | ICD-10-CM | POA: Diagnosis not present

## 2018-09-28 DIAGNOSIS — F419 Anxiety disorder, unspecified: Secondary | ICD-10-CM | POA: Diagnosis not present

## 2018-09-29 DIAGNOSIS — I10 Essential (primary) hypertension: Secondary | ICD-10-CM | POA: Diagnosis not present

## 2018-09-29 DIAGNOSIS — B349 Viral infection, unspecified: Secondary | ICD-10-CM | POA: Diagnosis not present

## 2018-09-29 DIAGNOSIS — J111 Influenza due to unidentified influenza virus with other respiratory manifestations: Secondary | ICD-10-CM | POA: Diagnosis not present

## 2018-09-29 DIAGNOSIS — E7800 Pure hypercholesterolemia, unspecified: Secondary | ICD-10-CM | POA: Diagnosis not present

## 2018-09-29 DIAGNOSIS — Z79899 Other long term (current) drug therapy: Secondary | ICD-10-CM | POA: Diagnosis not present

## 2018-09-29 DIAGNOSIS — R509 Fever, unspecified: Secondary | ICD-10-CM | POA: Diagnosis not present

## 2018-09-29 DIAGNOSIS — Z7982 Long term (current) use of aspirin: Secondary | ICD-10-CM | POA: Diagnosis not present

## 2018-09-29 DIAGNOSIS — Z880 Allergy status to penicillin: Secondary | ICD-10-CM | POA: Diagnosis not present

## 2018-09-29 DIAGNOSIS — K219 Gastro-esophageal reflux disease without esophagitis: Secondary | ICD-10-CM | POA: Diagnosis not present

## 2018-10-08 ENCOUNTER — Encounter: Payer: Self-pay | Admitting: Cardiology

## 2018-10-08 ENCOUNTER — Other Ambulatory Visit: Payer: Self-pay

## 2018-10-08 ENCOUNTER — Ambulatory Visit: Payer: Medicare HMO | Admitting: Cardiology

## 2018-10-08 VITALS — BP 140/70 | HR 86 | Ht 63.0 in | Wt 142.0 lb

## 2018-10-08 DIAGNOSIS — I10 Essential (primary) hypertension: Secondary | ICD-10-CM

## 2018-10-08 DIAGNOSIS — I2510 Atherosclerotic heart disease of native coronary artery without angina pectoris: Secondary | ICD-10-CM

## 2018-10-08 DIAGNOSIS — E782 Mixed hyperlipidemia: Secondary | ICD-10-CM

## 2018-10-08 DIAGNOSIS — I739 Peripheral vascular disease, unspecified: Secondary | ICD-10-CM

## 2018-10-08 NOTE — Patient Instructions (Signed)

## 2018-10-08 NOTE — Progress Notes (Signed)
Clinical Summary Cathy Sharp is a 73 y.o.female seen today for follow up of the following medical problems.  1. CAD - previous PCI 09/2008 for UA, received DES to LCX - history of prior CABG in 2000 at [facility]. - 09/2015 [place] nuclear stress: no ischemia, LVEF 69%  - some recent chest pain with URI symptoms, atypical for cardiac. Prior had no symptoms - compliant with meds   2. Hyperlipidemia - muscle aches on lipitor,she ison pravastatin and tolerating well - labs followed by pcp   3. Carotid stenosis - mild bilateral disease 2011 - carotid US 01/2016 with mild bilateral disease.   4. PAD - 1999 had aortobifem bypass. Followed by vascular with appt 04/2016, recs to f/u only as needed.   - no recent symptoms    SH: travels with granddaughter for softball games.    Past Medical History:  Diagnosis Date  . Anxiety and depression   . CAD (coronary artery disease)    PCI, March, 2010  /   nuclear, July, 2010, no scar or ischemia  . Carotid artery disease (HCC)    Doppler November, 2011, mild atherosclerotic disease, less than 50% stenosis  LICA,  no significant stenosis or ICA,, vertebral is antegrade  . Dyslipidemia   . GERD (gastroesophageal reflux disease)   . Hx of CABG    2000  . Hx of carpal tunnel syndrome   . Hypertension   . PAD (peripheral artery disease) (HCC)    Aortobifem bypass, January, 1999  . Schatzki's ring    Rourk  . Shortness of breath      Allergies  Allergen Reactions  . Penicillins      Current Outpatient Medications  Medication Sig Dispense Refill  . amLODipine (NORVASC) 5 MG tablet Take 5 mg by mouth daily.  6  . aspirin 81 MG tablet Take 81 mg by mouth daily.    Marland Kitchen buPROPion (WELLBUTRIN XL) 150 MG 24 hr tablet Take 150 mg by mouth daily.    . calcium carbonate (OS-CAL) 600 MG tablet Take 600 mg by mouth daily.    . Cholecalciferol (VITAMIN D3) 1000 units CAPS Take by mouth.    . clonazePAM  (KLONOPIN) 0.5 MG tablet Take 1 mg by mouth at bedtime.     Marland Kitchen losartan-hydrochlorothiazide (HYZAAR) 50-12.5 MG tablet Take 1 tablet by mouth daily.  3  . nitroGLYCERIN (NITROSTAT) 0.4 MG SL tablet Place 1 tablet (0.4 mg total) under the tongue every 5 (five) minutes as needed. 25 tablet 3  . pravastatin (PRAVACHOL) 40 MG tablet Take 40 mg by mouth daily.  4  . ranitidine (ZANTAC) 150 MG capsule Take 150 mg by mouth 2 (two) times daily.     No current facility-administered medications for this visit.      Past Surgical History:  Procedure Laterality Date  . AORTOBIFEMORAL BYPASS GRAFTING  1999  . CORONARY ARTERY BYPASS GRAFT  2000  . CYST REMOVAL FROM BREAST     LEFT  . HEMORRHOID SURGERY    . HERNIA REPAIR    . TUBAL LIGATION       Allergies  Allergen Reactions  . Penicillins       Family History  Problem Relation Age of Onset  . Heart attack Sister        deceased  . Heart attack Brother 43       deceased     Social History Cathy Sharp reports that she quit smoking about 21 years ago.  Her smoking use included cigarettes. She smoked 1.00 pack per day. She has never used smokeless tobacco. Cathy Sharp reports no history of alcohol use.   Review of Systems CONSTITUTIONAL: No weight loss, fever, chills, weakness or fatigue.  HEENT: Eyes: No visual loss, blurred vision, double vision or yellow sclerae.No hearing loss, sneezing, congestion, runny nose or sore throat.  SKIN: No rash or itching.  CARDIOVASCULAR: per hpi RESPIRATORY: No shortness of breath, cough or sputum.  GASTROINTESTINAL: No anorexia, nausea, vomiting or diarrhea. No abdominal pain or blood.  GENITOURINARY: No burning on urination, no polyuria NEUROLOGICAL: No headache, dizziness, syncope, paralysis, ataxia, numbness or tingling in the extremities. No change in bowel or bladder control.  MUSCULOSKELETAL: No muscle, back pain, joint pain or stiffness.  LYMPHATICS: No enlarged nodes. No history of  splenectomy.  PSYCHIATRIC: No history of depression or anxiety.  ENDOCRINOLOGIC: No reports of sweating, cold or heat intolerance. No polyuria or polydipsia.  Marland Kitchen   Physical Examination Today's Vitals   10/08/18 1056  BP: 140/70  Pulse: 86  SpO2: 96%  Weight: 142 lb (64.4 kg)  Height: 5\' 3"  (1.6 m)   Body mass index is 25.15 kg/m.  Gen: resting comfortably, no acute distress HEENT: no scleral icterus, pupils equal round and reactive, no palptable cervical adenopathy,  CV: RRR, no m/r/g, no jvd Resp: Clear to auscultation bilaterally GI: abdomen is soft, non-tender, non-distended, normal bowel sounds, no hepatosplenomegaly MSK: extremities are warm, no edema.  Skin: warm, no rash Neuro:  no focal deficits Psych: appropriate affect   Diagnostic Studies  09/2008 Cath RESULTS: Left main coronary artery: The left main coronary artery was free of significant disease.  Left anterior descending artery: The left anterior ascending artery had 90% stenosis located just after the takeoff of the first septal perforator and first diagonal Cathy Sharp. There was competing flow in the first diagonal Cathy Sharp and there was 90% ostial stenosis in the first diagonal Cathy Sharp.  Circumflex artery: The circumflex artery gave rise to an atrial Cathy Sharp and then had a 99% stenosis feeding a marginal Cathy Sharp.  Right coronary artery: The right coronary artery had some irregularities in its midportion. The posterior descending Cathy Sharp appeared to have a subbranch that was totally occluded, although it appeared likely that this was a small vessel.  The saphenous vein graft to the right coronary artery was completely occluded at its origin.  The LIMA graft to the LAD was atretic and possibly completely occluded at its midportion. It certainly was functionally occluded.   09/2008 Cath Invertention RESULTS: Initially stenosis in the mid circumflex artery was 99% with TIMI  II flow. Following stenting, this improved to 0% with TIMI III flow.  CONCLUSION: Successful PCI of the chronic total occlusion of the mid circumflex artery using a XIENCE drug-eluting stent with improvement in center narrowing from 99% to 0% and improvement of flow from TIMI II to TIMI III flow.  DISPOSITION: The patient returned to post angio room for further observation.   Assessment and Plan   1. CAD - doing well without symptoms, continue current meds   2. PAD -no symptoms, she will continue medical therapy.   4. Hyperlipidemia - did not tolerate high dose statin, doing well on pravastatin -request pcp labs, continue statin  5. HTN - mildly elevated today. Last week at ER visit for URI symptoms was 120/55. COntinue to monitor at this time     Antoine Poche, M.D

## 2018-10-26 DIAGNOSIS — E7800 Pure hypercholesterolemia, unspecified: Secondary | ICD-10-CM | POA: Diagnosis not present

## 2018-10-26 DIAGNOSIS — I2510 Atherosclerotic heart disease of native coronary artery without angina pectoris: Secondary | ICD-10-CM | POA: Diagnosis not present

## 2018-10-26 DIAGNOSIS — I10 Essential (primary) hypertension: Secondary | ICD-10-CM | POA: Diagnosis not present

## 2018-12-30 DIAGNOSIS — Z299 Encounter for prophylactic measures, unspecified: Secondary | ICD-10-CM | POA: Diagnosis not present

## 2018-12-30 DIAGNOSIS — I10 Essential (primary) hypertension: Secondary | ICD-10-CM | POA: Diagnosis not present

## 2018-12-30 DIAGNOSIS — Z6828 Body mass index (BMI) 28.0-28.9, adult: Secondary | ICD-10-CM | POA: Diagnosis not present

## 2018-12-30 DIAGNOSIS — I739 Peripheral vascular disease, unspecified: Secondary | ICD-10-CM | POA: Diagnosis not present

## 2018-12-30 DIAGNOSIS — K219 Gastro-esophageal reflux disease without esophagitis: Secondary | ICD-10-CM | POA: Diagnosis not present

## 2018-12-30 DIAGNOSIS — F419 Anxiety disorder, unspecified: Secondary | ICD-10-CM | POA: Diagnosis not present

## 2019-03-28 ENCOUNTER — Other Ambulatory Visit: Payer: Self-pay

## 2019-03-28 DIAGNOSIS — R6889 Other general symptoms and signs: Secondary | ICD-10-CM | POA: Diagnosis not present

## 2019-03-28 DIAGNOSIS — Z20822 Contact with and (suspected) exposure to covid-19: Secondary | ICD-10-CM

## 2019-03-30 LAB — NOVEL CORONAVIRUS, NAA: SARS-CoV-2, NAA: NOT DETECTED

## 2019-04-05 DIAGNOSIS — N183 Chronic kidney disease, stage 3 (moderate): Secondary | ICD-10-CM | POA: Diagnosis not present

## 2019-04-05 DIAGNOSIS — I10 Essential (primary) hypertension: Secondary | ICD-10-CM | POA: Diagnosis not present

## 2019-04-05 DIAGNOSIS — Z6827 Body mass index (BMI) 27.0-27.9, adult: Secondary | ICD-10-CM | POA: Diagnosis not present

## 2019-04-05 DIAGNOSIS — E7800 Pure hypercholesterolemia, unspecified: Secondary | ICD-10-CM | POA: Diagnosis not present

## 2019-04-05 DIAGNOSIS — Z299 Encounter for prophylactic measures, unspecified: Secondary | ICD-10-CM | POA: Diagnosis not present

## 2019-04-05 DIAGNOSIS — I739 Peripheral vascular disease, unspecified: Secondary | ICD-10-CM | POA: Diagnosis not present

## 2019-04-15 DIAGNOSIS — I2510 Atherosclerotic heart disease of native coronary artery without angina pectoris: Secondary | ICD-10-CM | POA: Diagnosis not present

## 2019-04-15 DIAGNOSIS — I10 Essential (primary) hypertension: Secondary | ICD-10-CM | POA: Diagnosis not present

## 2019-04-15 DIAGNOSIS — E7800 Pure hypercholesterolemia, unspecified: Secondary | ICD-10-CM | POA: Diagnosis not present

## 2019-04-28 DIAGNOSIS — N183 Chronic kidney disease, stage 3 unspecified: Secondary | ICD-10-CM | POA: Diagnosis not present

## 2019-04-28 DIAGNOSIS — E559 Vitamin D deficiency, unspecified: Secondary | ICD-10-CM | POA: Diagnosis not present

## 2019-04-28 DIAGNOSIS — E7800 Pure hypercholesterolemia, unspecified: Secondary | ICD-10-CM | POA: Diagnosis not present

## 2019-04-28 DIAGNOSIS — Z1331 Encounter for screening for depression: Secondary | ICD-10-CM | POA: Diagnosis not present

## 2019-04-28 DIAGNOSIS — R5383 Other fatigue: Secondary | ICD-10-CM | POA: Diagnosis not present

## 2019-04-28 DIAGNOSIS — Z0000 Encounter for general adult medical examination without abnormal findings: Secondary | ICD-10-CM | POA: Diagnosis not present

## 2019-04-28 DIAGNOSIS — Z7189 Other specified counseling: Secondary | ICD-10-CM | POA: Diagnosis not present

## 2019-04-28 DIAGNOSIS — Z1339 Encounter for screening examination for other mental health and behavioral disorders: Secondary | ICD-10-CM | POA: Diagnosis not present

## 2019-04-28 DIAGNOSIS — Z299 Encounter for prophylactic measures, unspecified: Secondary | ICD-10-CM | POA: Diagnosis not present

## 2019-04-28 DIAGNOSIS — I10 Essential (primary) hypertension: Secondary | ICD-10-CM | POA: Diagnosis not present

## 2019-04-28 DIAGNOSIS — Z79899 Other long term (current) drug therapy: Secondary | ICD-10-CM | POA: Diagnosis not present

## 2019-04-28 DIAGNOSIS — Z23 Encounter for immunization: Secondary | ICD-10-CM | POA: Diagnosis not present

## 2019-04-28 DIAGNOSIS — Z6827 Body mass index (BMI) 27.0-27.9, adult: Secondary | ICD-10-CM | POA: Diagnosis not present

## 2019-04-28 DIAGNOSIS — Z1211 Encounter for screening for malignant neoplasm of colon: Secondary | ICD-10-CM | POA: Diagnosis not present

## 2019-05-25 ENCOUNTER — Encounter: Payer: Self-pay | Admitting: Cardiology

## 2019-05-25 ENCOUNTER — Other Ambulatory Visit: Payer: Self-pay

## 2019-05-25 ENCOUNTER — Ambulatory Visit: Payer: Medicare HMO | Admitting: Cardiology

## 2019-05-25 VITALS — BP 133/75 | HR 88 | Temp 96.9°F | Ht 62.0 in | Wt 144.0 lb

## 2019-05-25 DIAGNOSIS — I6523 Occlusion and stenosis of bilateral carotid arteries: Secondary | ICD-10-CM | POA: Diagnosis not present

## 2019-05-25 DIAGNOSIS — E782 Mixed hyperlipidemia: Secondary | ICD-10-CM

## 2019-05-25 DIAGNOSIS — I2510 Atherosclerotic heart disease of native coronary artery without angina pectoris: Secondary | ICD-10-CM | POA: Diagnosis not present

## 2019-05-25 DIAGNOSIS — I739 Peripheral vascular disease, unspecified: Secondary | ICD-10-CM | POA: Diagnosis not present

## 2019-05-25 DIAGNOSIS — I10 Essential (primary) hypertension: Secondary | ICD-10-CM | POA: Diagnosis not present

## 2019-05-25 NOTE — Patient Instructions (Signed)

## 2019-05-25 NOTE — Progress Notes (Signed)
Clinical Summary Cathy Sharp is a 73 y.o.female seen today for follow up of the following medical problems.  1. CAD - previous PCI 09/2008 for UA, received DES to LCX - history of prior CABG in 2000 at [facility]. - 09/2015 [place] nuclear stress: no ischemia, LVEF 69%  - no recent chest pain. No SOB or DOE - compliant with meds.    2. Hyperlipidemia - muscle aches on lipitor,she ison pravastatin and tolerating well - most recent labs with pcp   3. Carotid stenosis - mild bilateral disease 2011 - carotid US 01/2016 with mild bilateral disease.  - no recent neuro symptoms   4. PAD - 1999 had aortobifem bypass. Followed by vascular with appt 04/2016, recs to f/u only as needed.   - no recent claudications.     SH: travels with granddaughter for softball games, she is 73 years old. 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 Anxiety and depression   . CAD (coronary artery disease)    PCI, March, 2010  /   nuclear, July, 2010, no scar or ischemia  . Carotid artery disease (HCC)    Doppler November, 2011, mild atherosclerotic disease, less than 50% stenosis  LICA,  no significant stenosis or ICA,, vertebral is antegrade  . Dyslipidemia   . GERD (gastroesophageal reflux disease)   . Hx of CABG    2000  . Hx of carpal tunnel syndrome   . Hypertension   . PAD (peripheral artery disease) (HCC)    Aortobifem bypass, January, 1999  . Schatzki's ring    Rourk  . Shortness of breath      Allergies  Allergen Reactions  . Penicillins      Current Outpatient Medications  Medication Sig Dispense Refill  . amLODipine (NORVASC) 5 MG tablet Take 5 mg by mouth daily.  6  . aspirin 81 MG tablet Take 81 mg by mouth daily.    Marland Kitchen buPROPion (WELLBUTRIN XL) 150 MG 24 hr tablet Take 150 mg by mouth daily.    . calcium carbonate (OS-CAL) 600 MG tablet Take 600 mg by mouth daily.    . Cholecalciferol (VITAMIN D3)  1000 units CAPS Take by mouth.    . clonazePAM (KLONOPIN) 0.5 MG tablet Take 1 mg by mouth at bedtime.     Marland Kitchen losartan-hydrochlorothiazide (HYZAAR) 50-12.5 MG tablet Take 1 tablet by mouth daily.  3  . nitroGLYCERIN (NITROSTAT) 0.4 MG SL tablet Place 1 tablet (0.4 mg total) under the tongue every 5 (five) minutes as needed. 25 tablet 3  . pravastatin (PRAVACHOL) 40 MG tablet Take 40 mg by mouth daily.  4   No current facility-administered medications for this visit.      Past Surgical History:  Procedure Laterality Date  . AORTOBIFEMORAL BYPASS GRAFTING  1999  . CORONARY ARTERY BYPASS GRAFT  2000  . CYST REMOVAL FROM BREAST     LEFT  . HEMORRHOID SURGERY    . HERNIA REPAIR    . TUBAL LIGATION       Allergies  Allergen Reactions  . Penicillins       Family History  Problem Relation Age of Onset  . Heart attack Sister        deceased  . Heart attack Brother 1       deceased     Social History Cathy Sharp reports that she quit smoking about 21 years ago. Her smoking use included cigarettes.  She smoked 1.00 pack per day. She has never used smokeless tobacco. Cathy Sharp reports no history of alcohol use.   Review of Systems CONSTITUTIONAL: No weight loss, fever, chills, weakness or fatigue.  HEENT: Eyes: No visual loss, blurred vision, double vision or yellow sclerae.No hearing loss, sneezing, congestion, runny nose or sore throat.  SKIN: No rash or itching.  CARDIOVASCULAR: per hpi RESPIRATORY: No shortness of breath, cough or sputum.  GASTROINTESTINAL: No anorexia, nausea, vomiting or diarrhea. No abdominal pain or blood.  GENITOURINARY: No burning on urination, no polyuria NEUROLOGICAL: No headache, dizziness, syncope, paralysis, ataxia, numbness or tingling in the extremities. No change in bowel or bladder control.  MUSCULOSKELETAL: No muscle, back pain, joint pain or stiffness.  LYMPHATICS: No enlarged nodes. No history of splenectomy.  PSYCHIATRIC: No  history of depression or anxiety.  ENDOCRINOLOGIC: No reports of sweating, cold or heat intolerance. No polyuria or polydipsia.  Marland Kitchen   Physical Examination Today's Vitals   05/25/19 1535  BP: 133/75  Pulse: 88  Temp: (!) 96.9 F (36.1 C)  TempSrc: Temporal  SpO2: 98%  Weight: 144 lb (65.3 kg)  Height: 5\' 2"  (1.575 m)   Body mass index is 26.34 kg/m.  Gen: resting comfortably, no acute distress HEENT: no scleral icterus, pupils equal round and reactive, no palptable cervical adenopathy,  CV: RRR, no mr/g, no jvd Resp: Clear to auscultation bilaterally GI: abdomen is soft, non-tender, non-distended, normal bowel sounds, no hepatosplenomegaly MSK: extremities are warm, no edema.  Skin: warm, no rash Neuro:  no focal deficits Psych: appropriate affect   Diagnostic Studies 09/2008 Cath RESULTS: Left main coronary artery: The left main coronary artery was free of significant disease.  Left anterior descending artery: The left anterior ascending artery had 90% stenosis located just after the takeoff of the first septal perforator and first diagonal Jamel Holzmann. There was competing flow in the first diagonal Modesta Sammons and there was 90% ostial stenosis in the first diagonal Truitt Cruey.  Circumflex artery: The circumflex artery gave rise to an atrial Perle Gibbon and then had a 99% stenosis feeding a marginal Slayden Mennenga.  Right coronary artery: The right coronary artery had some irregularities in its midportion. The posterior descending Joscelyne Renville appeared to have a subbranch that was totally occluded, although it appeared likely that this was a small vessel.  The saphenous vein graft to the right coronary artery was completely occluded at its origin.  The LIMA graft to the LAD was atretic and possibly completely occluded at its midportion. It certainly was functionally occluded.   09/2008 Cath Invertention RESULTS: Initially stenosis in the mid circumflex artery was  99% with TIMI II flow. Following stenting, this improved to 0% with TIMI III flow.  CONCLUSION: Successful PCI of the chronic total occlusion of the mid circumflex artery using a XIENCE drug-eluting stent with improvement in center narrowing from 99% to 0% and improvement of flow from TIMI II to TIMI III flow.  DISPOSITION: The patient returned to post angio room for further observation.    Assessment and Plan  1. CAD - no recent symptoms, continue current meds   2. PAD - no recent symptoms, continue medical therapy.   4. Hyperlipidemia - did not tolerate high dose statin, doing well on pravastatin - continue statin, request labs from pcp  5. HTN - essentially at goal, continue current meds  6. Carotid stenosis - just mild disease by prior imaging, likely repeat study in 1-2 years   F/u 1 year  Arnoldo Lenis, M.D.

## 2019-07-05 DIAGNOSIS — I739 Peripheral vascular disease, unspecified: Secondary | ICD-10-CM | POA: Diagnosis not present

## 2019-07-05 DIAGNOSIS — Z299 Encounter for prophylactic measures, unspecified: Secondary | ICD-10-CM | POA: Diagnosis not present

## 2019-07-05 DIAGNOSIS — I10 Essential (primary) hypertension: Secondary | ICD-10-CM | POA: Diagnosis not present

## 2019-07-05 DIAGNOSIS — M791 Myalgia, unspecified site: Secondary | ICD-10-CM | POA: Diagnosis not present

## 2019-07-05 DIAGNOSIS — Z6827 Body mass index (BMI) 27.0-27.9, adult: Secondary | ICD-10-CM | POA: Diagnosis not present

## 2019-07-05 DIAGNOSIS — N183 Chronic kidney disease, stage 3 unspecified: Secondary | ICD-10-CM | POA: Diagnosis not present

## 2019-10-03 DIAGNOSIS — Z79899 Other long term (current) drug therapy: Secondary | ICD-10-CM | POA: Diagnosis not present

## 2019-10-03 DIAGNOSIS — N183 Chronic kidney disease, stage 3 unspecified: Secondary | ICD-10-CM | POA: Diagnosis not present

## 2019-10-03 DIAGNOSIS — Z87891 Personal history of nicotine dependence: Secondary | ICD-10-CM | POA: Diagnosis not present

## 2019-10-03 DIAGNOSIS — I739 Peripheral vascular disease, unspecified: Secondary | ICD-10-CM | POA: Diagnosis not present

## 2019-10-03 DIAGNOSIS — Z299 Encounter for prophylactic measures, unspecified: Secondary | ICD-10-CM | POA: Diagnosis not present

## 2019-10-03 DIAGNOSIS — I10 Essential (primary) hypertension: Secondary | ICD-10-CM | POA: Diagnosis not present

## 2019-10-03 DIAGNOSIS — I2510 Atherosclerotic heart disease of native coronary artery without angina pectoris: Secondary | ICD-10-CM | POA: Diagnosis not present

## 2019-10-03 DIAGNOSIS — Z6827 Body mass index (BMI) 27.0-27.9, adult: Secondary | ICD-10-CM | POA: Diagnosis not present

## 2020-01-02 DIAGNOSIS — F419 Anxiety disorder, unspecified: Secondary | ICD-10-CM | POA: Diagnosis not present

## 2020-01-02 DIAGNOSIS — N183 Chronic kidney disease, stage 3 unspecified: Secondary | ICD-10-CM | POA: Diagnosis not present

## 2020-01-02 DIAGNOSIS — I10 Essential (primary) hypertension: Secondary | ICD-10-CM | POA: Diagnosis not present

## 2020-01-02 DIAGNOSIS — Z299 Encounter for prophylactic measures, unspecified: Secondary | ICD-10-CM | POA: Diagnosis not present

## 2020-01-02 DIAGNOSIS — L820 Inflamed seborrheic keratosis: Secondary | ICD-10-CM | POA: Diagnosis not present

## 2020-04-03 DIAGNOSIS — I10 Essential (primary) hypertension: Secondary | ICD-10-CM | POA: Diagnosis not present

## 2020-04-03 DIAGNOSIS — N183 Chronic kidney disease, stage 3 unspecified: Secondary | ICD-10-CM | POA: Diagnosis not present

## 2020-04-03 DIAGNOSIS — I739 Peripheral vascular disease, unspecified: Secondary | ICD-10-CM | POA: Diagnosis not present

## 2020-04-03 DIAGNOSIS — F17210 Nicotine dependence, cigarettes, uncomplicated: Secondary | ICD-10-CM | POA: Diagnosis not present

## 2020-04-03 DIAGNOSIS — I2510 Atherosclerotic heart disease of native coronary artery without angina pectoris: Secondary | ICD-10-CM | POA: Diagnosis not present

## 2020-04-03 DIAGNOSIS — Z299 Encounter for prophylactic measures, unspecified: Secondary | ICD-10-CM | POA: Diagnosis not present

## 2020-05-02 DIAGNOSIS — Z7189 Other specified counseling: Secondary | ICD-10-CM | POA: Diagnosis not present

## 2020-05-02 DIAGNOSIS — Z0000 Encounter for general adult medical examination without abnormal findings: Secondary | ICD-10-CM | POA: Diagnosis not present

## 2020-05-02 DIAGNOSIS — Z1331 Encounter for screening for depression: Secondary | ICD-10-CM | POA: Diagnosis not present

## 2020-05-02 DIAGNOSIS — Z1339 Encounter for screening examination for other mental health and behavioral disorders: Secondary | ICD-10-CM | POA: Diagnosis not present

## 2020-05-02 DIAGNOSIS — Z299 Encounter for prophylactic measures, unspecified: Secondary | ICD-10-CM | POA: Diagnosis not present

## 2020-05-02 DIAGNOSIS — R5383 Other fatigue: Secondary | ICD-10-CM | POA: Diagnosis not present

## 2020-05-02 DIAGNOSIS — Z79899 Other long term (current) drug therapy: Secondary | ICD-10-CM | POA: Diagnosis not present

## 2020-05-02 DIAGNOSIS — E559 Vitamin D deficiency, unspecified: Secondary | ICD-10-CM | POA: Diagnosis not present

## 2020-05-02 DIAGNOSIS — E7800 Pure hypercholesterolemia, unspecified: Secondary | ICD-10-CM | POA: Diagnosis not present

## 2020-05-02 DIAGNOSIS — Z6827 Body mass index (BMI) 27.0-27.9, adult: Secondary | ICD-10-CM | POA: Diagnosis not present

## 2020-06-27 DIAGNOSIS — I10 Essential (primary) hypertension: Secondary | ICD-10-CM | POA: Diagnosis not present

## 2020-06-27 DIAGNOSIS — Z299 Encounter for prophylactic measures, unspecified: Secondary | ICD-10-CM | POA: Diagnosis not present

## 2020-06-27 DIAGNOSIS — M791 Myalgia, unspecified site: Secondary | ICD-10-CM | POA: Diagnosis not present

## 2020-06-27 DIAGNOSIS — E7800 Pure hypercholesterolemia, unspecified: Secondary | ICD-10-CM | POA: Diagnosis not present

## 2020-06-27 DIAGNOSIS — N183 Chronic kidney disease, stage 3 unspecified: Secondary | ICD-10-CM | POA: Diagnosis not present

## 2020-06-27 DIAGNOSIS — Z23 Encounter for immunization: Secondary | ICD-10-CM | POA: Diagnosis not present

## 2020-09-20 DIAGNOSIS — Z1231 Encounter for screening mammogram for malignant neoplasm of breast: Secondary | ICD-10-CM | POA: Diagnosis not present

## 2020-09-25 DIAGNOSIS — I739 Peripheral vascular disease, unspecified: Secondary | ICD-10-CM | POA: Diagnosis not present

## 2020-09-25 DIAGNOSIS — Z6827 Body mass index (BMI) 27.0-27.9, adult: Secondary | ICD-10-CM | POA: Diagnosis not present

## 2020-09-25 DIAGNOSIS — I10 Essential (primary) hypertension: Secondary | ICD-10-CM | POA: Diagnosis not present

## 2020-09-25 DIAGNOSIS — Z87891 Personal history of nicotine dependence: Secondary | ICD-10-CM | POA: Diagnosis not present

## 2020-09-25 DIAGNOSIS — J309 Allergic rhinitis, unspecified: Secondary | ICD-10-CM | POA: Diagnosis not present

## 2020-09-25 DIAGNOSIS — Z299 Encounter for prophylactic measures, unspecified: Secondary | ICD-10-CM | POA: Diagnosis not present

## 2020-09-25 DIAGNOSIS — Z8616 Personal history of COVID-19: Secondary | ICD-10-CM | POA: Diagnosis not present

## 2020-10-04 ENCOUNTER — Ambulatory Visit: Payer: Self-pay

## 2020-10-04 ENCOUNTER — Ambulatory Visit (INDEPENDENT_AMBULATORY_CARE_PROVIDER_SITE_OTHER): Payer: Medicare HMO | Admitting: Orthopaedic Surgery

## 2020-10-04 ENCOUNTER — Other Ambulatory Visit: Payer: Self-pay

## 2020-10-04 ENCOUNTER — Ambulatory Visit (INDEPENDENT_AMBULATORY_CARE_PROVIDER_SITE_OTHER): Payer: Medicare HMO

## 2020-10-04 VITALS — Ht 63.0 in | Wt 143.0 lb

## 2020-10-04 DIAGNOSIS — M25562 Pain in left knee: Secondary | ICD-10-CM

## 2020-10-04 DIAGNOSIS — M25561 Pain in right knee: Secondary | ICD-10-CM | POA: Diagnosis not present

## 2020-10-04 DIAGNOSIS — M1712 Unilateral primary osteoarthritis, left knee: Secondary | ICD-10-CM | POA: Diagnosis not present

## 2020-10-04 DIAGNOSIS — M1711 Unilateral primary osteoarthritis, right knee: Secondary | ICD-10-CM | POA: Diagnosis not present

## 2020-10-04 DIAGNOSIS — M170 Bilateral primary osteoarthritis of knee: Secondary | ICD-10-CM | POA: Insufficient documentation

## 2020-10-04 MED ORDER — BUPIVACAINE HCL 0.25 % IJ SOLN
4.0000 mL | INTRAMUSCULAR | Status: AC | PRN
  Administered 2020-10-04: 4 mL via INTRA_ARTICULAR

## 2020-10-04 MED ORDER — LIDOCAINE HCL 1 % IJ SOLN
0.5000 mL | INTRAMUSCULAR | Status: AC | PRN
  Administered 2020-10-04: .5 mL

## 2020-10-04 MED ORDER — METHYLPREDNISOLONE ACETATE 40 MG/ML IJ SUSP
40.0000 mg | INTRAMUSCULAR | Status: AC | PRN
  Administered 2020-10-04: 40 mg via INTRA_ARTICULAR

## 2020-10-04 NOTE — Progress Notes (Signed)
Office Visit Note   Patient: Cathy Sharp           Date of Birth: 1945/10/17           MRN: [id number] Visit Date: 10/04/2020              Requested by: Kirstie Peri, MD [address] [place],  [place] [address] PCP: Kirstie Peri, MD   Assessment & Plan: Visit Diagnoses:  1. Pain in both knees, unspecified chronicity   2. Unilateral primary osteoarthritis, right knee   3. Bilateral primary osteoarthritis of knee     Plan: Right knee injection performed which she tolerated well.  We will see how she does with this she can return if she has increased problems would like to have the left knee injected.  Follow-Up Instructions: No follow-ups on file.   Orders:  Orders Placed This Encounter  Procedures  . Large Joint Inj: L knee  . Large Joint Inj  . XR Knee 1-2 Views Right  . XR Knee 1-2 Views Left   No orders of the defined types were placed in this encounter.     Procedures: Large Joint Inj: R knee on 10/04/2020 4:10 PM Indications: pain and joint swelling Details: 22 G 1.5 in needle, anterolateral approach  Arthrogram: No  Medications: 40 mg methylPREDNISolone acetate 40 MG/ML; 0.5 mL lidocaine 1 %; 4 mL bupivacaine 0.25 % Outcome: tolerated well, no immediate complications Procedure, treatment alternatives, risks and benefits explained, specific risks discussed. Consent was given by the patient. Immediately prior to procedure a time out was called to verify the correct patient, procedure, equipment, support staff and site/side marked as required. Patient was prepped and draped in the usual sterile fashion.       Clinical Data: No additional findings.   Subjective: Chief Complaint  Patient presents with  . Left Knee - Pain  . Right Knee - Pain    HPI 75 year old female with bilateral knee pain referred by Dr. Sherryll Burger.  She has noted some catching in her right knee intermittently and fearful that she may fall.  She has had some minimal swelling.  Pain wakes  her up at night.  She has had some locking has not had to manipulate her knee.  Repetitive catching mostly right knee that occurs with walking.  She not able to demonstrate particular position or motion and makes it lock and she feels like it is unpredictable.  She had previous problems with carpal tunnel previous CABG procedure.  She has some carotid artery disease hypertension.  Previous smoker quit 1999.  Review of Systems all other systems noncontributory to HPI.   Objective: Vital Signs: Ht 5\' 3"  (1.6 m)   Wt 143 lb (64.9 kg)   BMI 25.33 kg/m   Physical Exam Constitutional:      Appearance: She is well-developed.  HENT:     Head: Normocephalic.     Right Ear: External ear normal.     Left Ear: External ear normal.  Eyes:     Pupils: Pupils are equal, round, and reactive to light.  Neck:     Thyroid: No thyromegaly.     Trachea: No tracheal deviation.  Cardiovascular:     Rate and Rhythm: Normal rate.  Pulmonary:     Effort: Pulmonary effort is normal.  Abdominal:     Palpations: Abdomen is soft.  Skin:    General: Skin is warm and dry.  Neurological:     Mental Status: She is alert  and oriented to person, place, and time.  Psychiatric:        Behavior: Behavior normal.     Ortho Exam patient has more medial than lateral joint line tenderness more on the right than left knee.  No palpable Baker's cyst some pain with patellofemoral loading.  Negative logroll the hips negative straight leg raising 90 degrees knee and ankle jerk are 2+ and symmetrical no pitting edema.  Distal pulses are palpable.  Specialty Comments:  No specialty comments available.  Imaging: XR Knee 1-2 Views Left  Result Date: 10/04/2020 Standing AP both knees lateral left knee sunrise patella x-ray demonstrates 50% medial joint space narrowing.  Small osteophytes negative for acute changes no soft tissue calcification. Impression: Mild left knee osteoarthritis  XR Knee 1-2 Views Right  Result  Date: 10/04/2020 Standing AP both knees lateral right knee and sunrise patella x-ray demonstrates 50% joint space narrowing medially.  Small osteophytes tricompartmental. Impression: Right knee mild to moderate osteoarthritis.  Negative for acute changes.    PMFS History: Patient Active Problem List   Diagnosis Date Noted  . Bilateral primary osteoarthritis of knee 10/04/2020  . Hypertension   . CAD (coronary artery disease)   . PAD (peripheral artery disease) (HCC)   . GERD (gastroesophageal reflux disease)   . Anxiety and depression   . Hx of carpal tunnel syndrome   . Shortness of breath   . Hx of CABG   . Dyslipidemia   . Schatzki's ring   . Carotid artery disease (HCC)   . GERD 10/17/2009   Past Medical History:  Diagnosis Date  . Anxiety and depression   . CAD (coronary artery disease)    PCI, March, 2010  /   nuclear, July, 2010, no scar or ischemia  . Carotid artery disease (HCC)    Doppler November, 2011, mild atherosclerotic disease, less than 50% stenosis  LICA,  no significant stenosis or ICA,, vertebral is antegrade  . Dyslipidemia   . GERD (gastroesophageal reflux disease)   . Hx of CABG    2000  . Hx of carpal tunnel syndrome   . Hypertension   . PAD (peripheral artery disease) (HCC)    Aortobifem bypass, January, 1999  . Schatzki's ring    Rourk  . Shortness of breath     Family History  Problem Relation Age of Onset  . Heart attack Sister        deceased  . Heart attack Brother 55       deceased    Past Surgical History:  Procedure Laterality Date  . AORTOBIFEMORAL BYPASS GRAFTING  1999  . CORONARY ARTERY BYPASS GRAFT  2000  . CYST REMOVAL FROM BREAST     LEFT  . HEMORRHOID SURGERY    . HERNIA REPAIR    . TUBAL LIGATION     Social History   Occupational History  . Occupation: UNEMPLOYED  Tobacco Use  . Smoking status: Former Smoker    Packs/day: 1.00    Types: Cigarettes    Quit date: 07/28/1997    Years since quitting: 23.2  .  Smokeless tobacco: Never Used  . Tobacco comment: Year Quit: 1999  Vaping Use  . Vaping Use: Never used  Substance and Sexual Activity  . Alcohol use: No    Alcohol/week: 0.0 standard drinks  . Drug use: No  . Sexual activity: Not on file

## 2020-12-26 DIAGNOSIS — I739 Peripheral vascular disease, unspecified: Secondary | ICD-10-CM | POA: Diagnosis not present

## 2020-12-26 DIAGNOSIS — Z299 Encounter for prophylactic measures, unspecified: Secondary | ICD-10-CM | POA: Diagnosis not present

## 2020-12-26 DIAGNOSIS — I10 Essential (primary) hypertension: Secondary | ICD-10-CM | POA: Diagnosis not present

## 2020-12-26 DIAGNOSIS — L309 Dermatitis, unspecified: Secondary | ICD-10-CM | POA: Diagnosis not present

## 2020-12-26 DIAGNOSIS — N183 Chronic kidney disease, stage 3 unspecified: Secondary | ICD-10-CM | POA: Diagnosis not present

## 2021-03-28 DIAGNOSIS — I10 Essential (primary) hypertension: Secondary | ICD-10-CM | POA: Diagnosis not present

## 2021-03-28 DIAGNOSIS — L821 Other seborrheic keratosis: Secondary | ICD-10-CM | POA: Diagnosis not present

## 2021-03-28 DIAGNOSIS — Z299 Encounter for prophylactic measures, unspecified: Secondary | ICD-10-CM | POA: Diagnosis not present

## 2021-04-30 ENCOUNTER — Telehealth: Payer: Self-pay | Admitting: Cardiology

## 2021-04-30 ENCOUNTER — Encounter: Payer: Self-pay | Admitting: Cardiology

## 2021-04-30 ENCOUNTER — Ambulatory Visit: Payer: Medicare HMO | Admitting: Cardiology

## 2021-04-30 VITALS — BP 142/76 | HR 82 | Ht 62.0 in | Wt 148.8 lb

## 2021-04-30 DIAGNOSIS — I6523 Occlusion and stenosis of bilateral carotid arteries: Secondary | ICD-10-CM | POA: Diagnosis not present

## 2021-04-30 DIAGNOSIS — R0602 Shortness of breath: Secondary | ICD-10-CM

## 2021-04-30 DIAGNOSIS — I2510 Atherosclerotic heart disease of native coronary artery without angina pectoris: Secondary | ICD-10-CM

## 2021-04-30 DIAGNOSIS — I10 Essential (primary) hypertension: Secondary | ICD-10-CM

## 2021-04-30 DIAGNOSIS — E785 Hyperlipidemia, unspecified: Secondary | ICD-10-CM | POA: Diagnosis not present

## 2021-04-30 NOTE — Patient Instructions (Signed)
Medication Instructions:  Continue all current medications.  Labwork: none  Testing/Procedures: Your physician has requested that you have a carotid duplex. This test is an ultrasound of the carotid arteries in your neck. It looks at blood flow through these arteries that supply the brain with blood. Allow one hour for this exam. There are no restrictions or special instructions.  Your physician has requested that you have an echocardiogram. Echocardiography is a painless test that uses sound waves to create images of your heart. It provides your doctor with information about the size and shape of your heart and how well your heart's chambers and valves are working. This procedure takes approximately one hour. There are no restrictions for this procedure. Office will contact with results via phone or letter.     Follow-Up: 2 months   Any Other Special Instructions Will Be Listed Below (If Applicable).   If you need a refill on your cardiac medications before your next appointment, please call your pharmacy.

## 2021-04-30 NOTE — Telephone Encounter (Signed)
Checking percert on the following patient for testing scheduled at [facility].     ECHO  US CARTOID  06/03/2021

## 2021-04-30 NOTE — Progress Notes (Signed)
Clinical Summary Cathy Sharp is a 75 y.o.female seen today for follow up of the following medical problems.    1. CAD - previous PCI 09/2008 for UA, received DES to LCX - history of prior CABG in 2000 at [facility].  - 09/2015 [place] nuclear stress: no ischemia, LVEF 69%   - denies any chest pains - some recent SOB with activities. Started about 6 months ago, steady. DOE with vaccumming, taking trash cans from road - no LE edema, no orthopnea.  - cannot run on treadmill due to leg weakness     2. Hyperlipidemia - muscle aches on lipitor,she is on pravastatin and tolerating well Labs followed by pcp     3. Carotid stenosis - mild bilateral disease 2011  - carotid US 01/2016 with mild bilateral disease.  - she is due for repeat US     4. PAD - 1999 had aortobifem bypass. Followed by vascular with appt 04/2016, recs to f/u only as needed.      - some leg pains when laying in bed, not exertional.     5. HTN - compliant with meds   SH:. 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   Past Medical History:  Diagnosis Date   Anxiety and depression    CAD (coronary artery disease)    PCI, March, 2010  /   nuclear, July, 2010, no scar or ischemia   Carotid artery disease (HCC)    Doppler November, 2011, mild atherosclerotic disease, less than 50% stenosis  LICA,  no significant stenosis or ICA,, vertebral is antegrade   Dyslipidemia    GERD (gastroesophageal reflux disease)    Hx of CABG    2000   Hx of carpal tunnel syndrome    Hypertension    PAD (peripheral artery disease) (HCC)    Aortobifem bypass, January, 1999   Schatzki's ring    Rourk   Shortness of breath      Allergies  Allergen Reactions   Penicillins      Current Outpatient Medications  Medication Sig Dispense Refill   amLODipine (NORVASC) 5 MG tablet Take 5 mg by mouth daily.  6   aspirin 81 MG tablet Take 81 mg by mouth daily.     buPROPion (WELLBUTRIN XL) 150 MG 24 hr tablet Take 150 mg by  mouth daily.     calcium carbonate (OS-CAL) 600 MG tablet Take 600 mg by mouth daily.     Cholecalciferol (VITAMIN D3) 1000 units CAPS Take by mouth.     clonazePAM (KLONOPIN) 0.5 MG tablet Take 1 mg by mouth at bedtime.      losartan-hydrochlorothiazide (HYZAAR) 50-12.5 MG tablet Take 1 tablet by mouth daily.  3   Multiple Vitamin (MULTI-VITAMIN) tablet Take by mouth.     nitroGLYCERIN (NITROSTAT) 0.4 MG SL tablet Place 1 tablet (0.4 mg total) under the tongue every 5 (five) minutes as needed. 25 tablet 3   pravastatin (PRAVACHOL) 40 MG tablet Take 40 mg by mouth daily.  4   No current facility-administered medications for this visit.     Past Surgical History:  Procedure Laterality Date   AORTOBIFEMORAL BYPASS GRAFTING  1999   CORONARY ARTERY BYPASS GRAFT  2000   CYST REMOVAL FROM BREAST     LEFT   HEMORRHOID SURGERY     HERNIA REPAIR     TUBAL LIGATION       Allergies  Allergen Reactions   Penicillins  Family History  Problem Relation Age of Onset   Heart attack Sister        deceased   Heart attack Brother 100       deceased     Social History Ms. Sprowl reports that she quit smoking about 23 years ago. Her smoking use included cigarettes. She smoked an average of 1 pack per day. She has never used smokeless tobacco. Ms. Banwart reports no history of alcohol use.   Review of Systems CONSTITUTIONAL: No weight loss, fever, chills, weakness or fatigue.  HEENT: Eyes: No visual loss, blurred vision, double vision or yellow sclerae.No hearing loss, sneezing, congestion, runny nose or sore throat.  SKIN: No rash or itching.  CARDIOVASCULAR: per hpi RESPIRATORY: No shortness of breath, cough or sputum.  GASTROINTESTINAL: No anorexia, nausea, vomiting or diarrhea. No abdominal pain or blood.  GENITOURINARY: No burning on urination, no polyuria NEUROLOGICAL: No headache, dizziness, syncope, paralysis, ataxia, numbness or tingling in the extremities. No change  in bowel or bladder control.  MUSCULOSKELETAL: No muscle, back pain, joint pain or stiffness.  LYMPHATICS: No enlarged nodes. No history of splenectomy.  PSYCHIATRIC: No history of depression or anxiety.  ENDOCRINOLOGIC: No reports of sweating, cold or heat intolerance. No polyuria or polydipsia.  Marland Kitchen   Physical Examination Today's Vitals   04/30/21 1047  BP: (!) 142/76  Pulse: 82  SpO2: 96%  Weight: 148 lb 12.8 oz (67.5 kg)  Height: 5\' 2"  (1.575 m)   Body mass index is 27.22 kg/m.  Gen: resting comfortably, no acute distress HEENT: no scleral icterus, pupils equal round and reactive, no palptable cervical adenopathy,  CV: RRR, no m/r/g no jvd Resp: Clear to auscultation bilaterally GI: abdomen is soft, non-tender, non-distended, normal bowel sounds, no hepatosplenomegaly MSK: extremities are warm, no edema.  Skin: warm, no rash Neuro:  no focal deficits Psych: appropriate affect   Diagnostic Studies  09/2008 Cath RESULTS:  Left main coronary artery:  The left main coronary artery was  free of significant disease.    Left anterior descending artery:  The left anterior ascending artery had  90% stenosis located just after the takeoff of the first septal  perforator and first diagonal Cathy Sharp.  There was competing flow in the  first diagonal Cathy Sharp and there was 90% ostial stenosis in the first  diagonal Cathy Sharp.    Circumflex artery:  The circumflex artery gave rise to an atrial Cathy Sharp  and then had a 99% stenosis feeding a marginal Cathy Sharp.    Right coronary artery:  The right coronary artery had some  irregularities in its midportion.  The posterior descending Cathy Sharp  appeared to have a subbranch that was totally occluded, although it  appeared likely that this was a small vessel.    The saphenous vein graft to the right coronary artery was completely  occluded at its origin.    The LIMA graft to the LAD was atretic and possibly completely occluded  at its  midportion.  It certainly was functionally occluded.     09/2008 Cath Invertention RESULTS:  Initially stenosis in the mid circumflex artery was 99% with  TIMI II flow.  Following stenting, this improved to 0% with TIMI III  flow.    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  DISPOSITION:  The patient returned to post angio room for further  observation.       Assessment and Plan  1. CAD - no chest pains but recent DOE/SOB - obtain echo initially, pending results likely plan for lexiscan. Cannnot run on treadmill due to leg weakness - EKG today shows SR, no ischemic changes   2. PAD - leg cramps at night, not exertional. Not consistent with claudication, monitor at this time    4. Hyperlipidemia - did not tolerate high dose statin, doing well on pravastatin - request labs from pcp   5. HTN -elevated today, previously had been controlled. Monitor trend, room to titrate norvasc if needed   6. Carotid stenosis - repeat carotid US      Antoine Poche, M.D

## 2021-05-02 ENCOUNTER — Encounter: Payer: Self-pay | Admitting: *Deleted

## 2021-05-07 DIAGNOSIS — Z1211 Encounter for screening for malignant neoplasm of colon: Secondary | ICD-10-CM | POA: Diagnosis not present

## 2021-05-07 DIAGNOSIS — Z79899 Other long term (current) drug therapy: Secondary | ICD-10-CM | POA: Diagnosis not present

## 2021-05-07 DIAGNOSIS — Z1339 Encounter for screening examination for other mental health and behavioral disorders: Secondary | ICD-10-CM | POA: Diagnosis not present

## 2021-05-07 DIAGNOSIS — Z0000 Encounter for general adult medical examination without abnormal findings: Secondary | ICD-10-CM | POA: Diagnosis not present

## 2021-05-07 DIAGNOSIS — Z7189 Other specified counseling: Secondary | ICD-10-CM | POA: Diagnosis not present

## 2021-05-07 DIAGNOSIS — E559 Vitamin D deficiency, unspecified: Secondary | ICD-10-CM | POA: Diagnosis not present

## 2021-05-07 DIAGNOSIS — F419 Anxiety disorder, unspecified: Secondary | ICD-10-CM | POA: Diagnosis not present

## 2021-05-07 DIAGNOSIS — I10 Essential (primary) hypertension: Secondary | ICD-10-CM | POA: Diagnosis not present

## 2021-05-07 DIAGNOSIS — R5383 Other fatigue: Secondary | ICD-10-CM | POA: Diagnosis not present

## 2021-05-07 DIAGNOSIS — Z23 Encounter for immunization: Secondary | ICD-10-CM | POA: Diagnosis not present

## 2021-05-07 DIAGNOSIS — Z299 Encounter for prophylactic measures, unspecified: Secondary | ICD-10-CM | POA: Diagnosis not present

## 2021-05-07 DIAGNOSIS — E7800 Pure hypercholesterolemia, unspecified: Secondary | ICD-10-CM | POA: Diagnosis not present

## 2021-05-07 DIAGNOSIS — Z6827 Body mass index (BMI) 27.0-27.9, adult: Secondary | ICD-10-CM | POA: Diagnosis not present

## 2021-05-07 DIAGNOSIS — Z1331 Encounter for screening for depression: Secondary | ICD-10-CM | POA: Diagnosis not present

## 2021-06-03 ENCOUNTER — Other Ambulatory Visit: Payer: Self-pay

## 2021-06-03 ENCOUNTER — Ambulatory Visit (HOSPITAL_COMMUNITY)
Admission: RE | Admit: 2021-06-03 | Discharge: 2021-06-03 | Disposition: A | Discharge status: Home / Self Care | Payer: Medicare HMO | Source: Ambulatory Visit | Attending: Cardiology | Admitting: Cardiology

## 2021-06-03 DIAGNOSIS — I6523 Occlusion and stenosis of bilateral carotid arteries: Secondary | ICD-10-CM | POA: Diagnosis not present

## 2021-06-03 DIAGNOSIS — R0602 Shortness of breath: Secondary | ICD-10-CM | POA: Insufficient documentation

## 2021-06-03 LAB — ECHOCARDIOGRAM COMPLETE
Area-P 1/2: 2.62 cm2
S' Lateral: 2.5 cm

## 2021-06-03 NOTE — Progress Notes (Signed)
*  PRELIMINARY RESULTS* Echocardiogram 2D Echocardiogram has been performed.  Stacey Drain 06/03/2021, 2:23 PM

## 2021-06-06 ENCOUNTER — Telehealth: Payer: Self-pay | Admitting: *Deleted

## 2021-06-06 ENCOUNTER — Encounter: Payer: Self-pay | Admitting: *Deleted

## 2021-06-06 DIAGNOSIS — R0609 Other forms of dyspnea: Secondary | ICD-10-CM

## 2021-06-06 NOTE — Telephone Encounter (Signed)
Patient informed and verbalized understanding of plan. Lexiscan instructions reviewed and copy left upfront for pick up Copy sent to PCP

## 2021-06-06 NOTE — Telephone Encounter (Signed)
-----   Message from Antoine Poche, MD sent at 06/04/2021  2:57 PM EST ----- Carotid US shows just very mild plaque in the arteries of the neck, nothing to be of concern  Dominga Ferry MD

## 2021-06-06 NOTE — Telephone Encounter (Signed)
-----   Message from Antoine Poche, MD sent at 06/04/2021  2:33 PM EST ----- Echo overall looks goodl Heart pumping function is normal, no significant heart valve issue. Can she get a lexiscan for DOE.   Dominga Ferry MD

## 2021-06-11 ENCOUNTER — Telehealth: Payer: Self-pay | Admitting: Cardiology

## 2021-06-11 NOTE — Telephone Encounter (Signed)
Checking percert on the following patient for testing scheduled at [place].     LEXISCAN  06/18/2021  

## 2021-06-18 ENCOUNTER — Other Ambulatory Visit: Payer: Self-pay

## 2021-06-18 ENCOUNTER — Encounter (HOSPITAL_COMMUNITY)
Admission: RE | Admit: 2021-06-18 | Discharge: 2021-06-18 | Disposition: A | Discharge status: Home / Self Care | Payer: Medicare HMO | Source: Ambulatory Visit | Attending: Cardiology | Admitting: Cardiology

## 2021-06-18 ENCOUNTER — Ambulatory Visit (HOSPITAL_COMMUNITY)
Admission: RE | Admit: 2021-06-18 | Discharge: 2021-06-18 | Disposition: A | Discharge status: Home / Self Care | Payer: Medicare HMO | Source: Ambulatory Visit | Attending: Cardiology | Admitting: Cardiology

## 2021-06-18 DIAGNOSIS — R0609 Other forms of dyspnea: Secondary | ICD-10-CM | POA: Insufficient documentation

## 2021-06-18 LAB — NM MYOCAR MULTI W/SPECT W/WALL MOTION / EF
LV dias vol: 64 mL (ref 46–106)
LV sys vol: 16 mL
Nuc Stress EF: 75 %
Peak HR: 92 {beats}/min
RATE: 0.3
Rest HR: 73 {beats}/min
Rest Nuclear Isotope Dose: 7.8 mCi
SDS: 3
SRS: 2
SSS: 5
ST Depression (mm): 0 mm
Stress Nuclear Isotope Dose: 26.2 mCi
TID: 1.02

## 2021-06-18 MED ORDER — TECHNETIUM TC 99M TETROFOSMIN IV KIT
10.0000 | PACK | Freq: Once | INTRAVENOUS | Status: AC | PRN
  Administered 2021-06-18: 7.8 via INTRAVENOUS

## 2021-06-18 MED ORDER — TECHNETIUM TC 99M TETROFOSMIN IV KIT
30.0000 | PACK | Freq: Once | INTRAVENOUS | Status: AC | PRN
  Administered 2021-06-18: 26.2 via INTRAVENOUS

## 2021-06-18 MED ORDER — REGADENOSON 0.4 MG/5ML IV SOLN
INTRAVENOUS | Status: AC
  Administered 2021-06-18: 0.4 mg via INTRAVENOUS
  Filled 2021-06-18: qty 5

## 2021-06-18 MED ORDER — SODIUM CHLORIDE FLUSH 0.9 % IV SOLN
INTRAVENOUS | Status: AC
  Administered 2021-06-18: 10 mL via INTRAVENOUS
  Filled 2021-06-18: qty 10

## 2021-06-28 DIAGNOSIS — Z299 Encounter for prophylactic measures, unspecified: Secondary | ICD-10-CM | POA: Diagnosis not present

## 2021-06-28 DIAGNOSIS — R059 Cough, unspecified: Secondary | ICD-10-CM | POA: Diagnosis not present

## 2021-06-28 DIAGNOSIS — Z6827 Body mass index (BMI) 27.0-27.9, adult: Secondary | ICD-10-CM | POA: Diagnosis not present

## 2021-06-28 DIAGNOSIS — I10 Essential (primary) hypertension: Secondary | ICD-10-CM | POA: Diagnosis not present

## 2021-06-28 DIAGNOSIS — L309 Dermatitis, unspecified: Secondary | ICD-10-CM | POA: Diagnosis not present

## 2021-07-02 ENCOUNTER — Telehealth: Payer: Self-pay | Admitting: Cardiology

## 2021-07-02 NOTE — Telephone Encounter (Signed)
  Pt is returning call, she requested to call her cell # [phone]

## 2021-07-02 NOTE — Telephone Encounter (Signed)
Lesle Chris, LPN  75/07/6999  4:37 PM EST Back to Top    Notified, copy to pcp.    Lesle Chris, LPN  74/03/4495 75:91 AM EST     Left message to return call.   Antoine Poche, MD  06/24/2021  9:02 AM EST     Normal stress test, no evidence of any blockages in the arteries of the heart.    Dominga Ferry MD

## 2021-07-09 ENCOUNTER — Ambulatory Visit: Payer: Medicare HMO | Admitting: Cardiology

## 2021-07-09 ENCOUNTER — Ambulatory Visit: Payer: Medicare HMO | Admitting: Family Medicine

## 2021-07-10 ENCOUNTER — Ambulatory Visit: Payer: Medicare HMO | Admitting: Cardiology

## 2021-07-10 ENCOUNTER — Encounter: Payer: Self-pay | Admitting: Cardiology

## 2021-07-10 VITALS — BP 144/70 | HR 82 | Ht 62.0 in | Wt 143.6 lb

## 2021-07-10 DIAGNOSIS — I2510 Atherosclerotic heart disease of native coronary artery without angina pectoris: Secondary | ICD-10-CM | POA: Diagnosis not present

## 2021-07-10 DIAGNOSIS — E782 Mixed hyperlipidemia: Secondary | ICD-10-CM | POA: Diagnosis not present

## 2021-07-10 MED ORDER — PRAVASTATIN SODIUM 40 MG PO TABS: 80.0000 mg | ORAL_TABLET | Freq: Every day | ORAL | Status: AC

## 2021-07-10 NOTE — Addendum Note (Signed)
Addended by: Lesle Chris on: 07/10/2021 02:39 PM   Modules accepted: Orders

## 2021-07-10 NOTE — Progress Notes (Signed)
Clinical Summary Cathy Sharp is a 75 y.o.female seen today for follow up of the following medical problems. This is a focused visit on history of CAD and recent SOB as well as hyperlipidemia   1. CAD - previous PCI 09/2008 for UA, received DES to LCX - history of prior CABG in 2000 at [facility].  - 09/2015 [place] nuclear stress: no ischemia, LVEF 69%     05/2021 echo: LVEF 60-65%, no WMAs, grade I diastolic dysfunction, normal RV function 05/2021 nuclear stress: no ischemia  Chronic SOB unchanged since last visit      2. Hyperlipidemia - muscle aches on lipitor,she is on pravastatin and tolerating well   04/2021 TC 180 TG 194 HDL 46 LDL 101       3. SOB - former smoker x 40 years - worked in [employer] in the past.     [facility] 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   Past Medical History:  Diagnosis Date   Anxiety and depression    CAD (coronary artery disease)    PCI, March, 2010  /   nuclear, July, 2010, no scar or ischemia   Carotid artery disease ([place])    Doppler November, 2011, mild atherosclerotic disease, less than [id number] stenosis  LICA,  no significant stenosis or ICA,, vertebral is antegrade   Dyslipidemia    GERD (gastroesophageal reflux disease)    Hx of CABG    2000   Hx of carpal tunnel syndrome    Hypertension    PAD (peripheral artery disease) ([place])    Aortobifem bypass, January, 1999   Schatzki's ring    Rourk   Shortness of breath      Allergies  Allergen Reactions   Penicillins      Current Outpatient Medications  Medication Sig Dispense Refill   amLODipine (NORVASC) 5 MG tablet Take 5 mg by mouth daily.  6   aspirin 81 MG tablet Take 81 mg by mouth daily.     buPROPion (WELLBUTRIN XL) 150 MG 24 hr tablet Take 150 mg by mouth daily.     calcium carbonate (OS-CAL) 600 MG tablet Take 600 mg by mouth daily. (Patient not taking: Reported on 04/30/2021)     Cholecalciferol (VITAMIN D3) 1000 units CAPS Take by mouth.     clonazePAM  (KLONOPIN) 0.5 MG tablet Take 1 mg by mouth at bedtime.      losartan-hydrochlorothiazide (HYZAAR) 50-12.5 MG tablet Take 1 tablet by mouth daily.  3   Multiple Vitamin (MULTI-VITAMIN) tablet Take by mouth.     nitroGLYCERIN (NITROSTAT) 0.4 MG SL tablet Place 1 tablet (0.4 mg total) under the tongue every 5 (five) minutes as needed. 25 tablet 3   pravastatin (PRAVACHOL) 40 MG tablet Take 40 mg by mouth daily.  4   No current facility-administered medications for this visit.     Past Surgical History:  Procedure Laterality Date   AORTOBIFEMORAL BYPASS GRAFTING  1999   CORONARY ARTERY BYPASS GRAFT  2000   CYST REMOVAL FROM BREAST     LEFT   HEMORRHOID SURGERY     HERNIA REPAIR     TUBAL LIGATION       Allergies  Allergen Reactions   Penicillins       Family History  Problem Relation Age of Onset   Heart attack Sister        deceased   Heart attack Brother 60       deceased  Social History Ms. Andalon reports that she quit smoking about 23 years ago. Her smoking use included cigarettes. She smoked an average of 1 pack per day. She has never used smokeless tobacco. Ms. Thinnes reports no history of alcohol use.   Review of Systems CONSTITUTIONAL: No weight loss, fever, chills, weakness or fatigue.  HEENT: Eyes: No visual loss, blurred vision, double vision or yellow sclerae.No hearing loss, sneezing, congestion, runny nose or sore throat.  SKIN: No rash or itching.  CARDIOVASCULAR: per hpi RESPIRATORY: per hpi GASTROINTESTINAL: No anorexia, nausea, vomiting or diarrhea. No abdominal pain or blood.  GENITOURINARY: No burning on urination, no polyuria NEUROLOGICAL: No headache, dizziness, syncope, paralysis, ataxia, numbness or tingling in the extremities. No change in bowel or bladder control.  MUSCULOSKELETAL: No muscle, back pain, joint pain or stiffness.  LYMPHATICS: No enlarged nodes. No history of splenectomy.  PSYCHIATRIC: No history of depression or  anxiety.  ENDOCRINOLOGIC: No reports of sweating, cold or heat intolerance. No polyuria or polydipsia.  Marland Kitchen   Physical Examination Today's Vitals   07/10/21 1355  BP: (!) 144/70  Pulse: 82  SpO2: 98%  Weight: 143 lb 9.6 oz (65.1 kg)  Height: 5\' 2"  (1.575 m)   Body mass index is 26.26 kg/m.  Gen: resting comfortably, no acute distress HEENT: no scleral icterus, pupils equal round and reactive, no palptable cervical adenopathy,  CV: RRR, no mr/g no jvd Resp: Clear to auscultation bilaterally GI: abdomen is soft, non-tender, non-distended, normal bowel sounds, no hepatosplenomegaly MSK: extremities are warm, no edema.  Skin: warm, no rash Neuro:  no focal deficits Psych: appropriate affect   Diagnostic Studies  09/2008 Cath RESULTS:  Left main coronary artery:  The left main coronary artery was  free of significant disease.    Left anterior descending artery:  The left anterior ascending artery had  90% stenosis located just after the takeoff of the first septal  perforator and first diagonal Devi Hopman.  There was competing flow in the  first diagonal Zayana Salvador and there was 90% ostial stenosis in the first  diagonal Deondre Marinaro.    Circumflex artery:  The circumflex artery gave rise to an atrial Wednesday Ericsson  and then had a 99% stenosis feeding a marginal Richey Doolittle.    Right coronary artery:  The right coronary artery had some  irregularities in its midportion.  The posterior descending Leroy Trim  appeared to have a subbranch that was totally occluded, although it  appeared likely that this was a small vessel.    The saphenous vein graft to the right coronary artery was completely  occluded at its origin.    The LIMA graft to the LAD was atretic and possibly completely occluded  at its midportion.  It certainly was functionally occluded.     09/2008 Cath Invertention RESULTS:  Initially stenosis in the mid circumflex artery was 99% with  TIMI II flow.  Following stenting, this improved to  0% with TIMI III  flow.    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    DISPOSITION:  The patient returned to post angio room for further  observation.     05/2021 echo  IMPRESSIONS     1. Left ventricular ejection fraction, by estimation, is 60 to 65%. The  left ventricle has normal function. The  left ventricle has no regional  wall motion abnormalities. Left ventricular diastolic parameters are  consistent with Grade I diastolic  dysfunction (impaired relaxation).   2. Right ventricular systolic function is normal. The right ventricular  size is normal. Tricuspid regurgitation signal is inadequate for assessing  PA pressure.   3. The mitral valve is normal in structure. No evidence of mitral valve  regurgitation. No evidence of mitral stenosis.   4. The aortic valve is tricuspid. Aortic valve regurgitation is not  visualized. No aortic stenosis is present.   5. The inferior vena cava is normal in size with greater than 50%  respiratory variability, suggesting right atrial pressure of 3 mmHg.   Assessment and Plan  1. CAD - no chest pains but recent DOE/SOB - overall benign echo and stress test - if progression of symptoms would consider PFTs given prior smoking history, cardiac testing is reassuring.     2. Hyperlipidemia - not at goal, incraese pravastatin to 80mg  daily.      F/u 6 months    Arnoldo Lenis, M.D.

## 2021-07-10 NOTE — Patient Instructions (Signed)
Medication Instructions:  ?Increase Pravastatin to 80mg daily  ?Continue all other medications.    ? ?Labwork: ?none ? ?Testing/Procedures: ?none ? ?Follow-Up: ?6 months  ? ?Any Other Special Instructions Will Be Listed Below (If Applicable). ? ? ?If you need a refill on your cardiac medications before your next appointment, please call your pharmacy. ? ?

## 2021-09-26 DIAGNOSIS — I10 Essential (primary) hypertension: Secondary | ICD-10-CM | POA: Diagnosis not present

## 2021-09-26 DIAGNOSIS — I779 Disorder of arteries and arterioles, unspecified: Secondary | ICD-10-CM | POA: Diagnosis not present

## 2021-09-26 DIAGNOSIS — Z6827 Body mass index (BMI) 27.0-27.9, adult: Secondary | ICD-10-CM | POA: Diagnosis not present

## 2021-09-26 DIAGNOSIS — I739 Peripheral vascular disease, unspecified: Secondary | ICD-10-CM | POA: Diagnosis not present

## 2021-09-26 DIAGNOSIS — Z299 Encounter for prophylactic measures, unspecified: Secondary | ICD-10-CM | POA: Diagnosis not present

## 2021-09-26 DIAGNOSIS — N183 Chronic kidney disease, stage 3 unspecified: Secondary | ICD-10-CM | POA: Diagnosis not present

## 2021-09-26 DIAGNOSIS — Z87891 Personal history of nicotine dependence: Secondary | ICD-10-CM | POA: Diagnosis not present

## 2021-12-30 DIAGNOSIS — Z299 Encounter for prophylactic measures, unspecified: Secondary | ICD-10-CM | POA: Diagnosis not present

## 2021-12-30 DIAGNOSIS — I10 Essential (primary) hypertension: Secondary | ICD-10-CM | POA: Diagnosis not present

## 2021-12-30 DIAGNOSIS — Z0000 Encounter for general adult medical examination without abnormal findings: Secondary | ICD-10-CM | POA: Diagnosis not present

## 2021-12-30 DIAGNOSIS — R350 Frequency of micturition: Secondary | ICD-10-CM | POA: Diagnosis not present

## 2021-12-30 DIAGNOSIS — Z6827 Body mass index (BMI) 27.0-27.9, adult: Secondary | ICD-10-CM | POA: Diagnosis not present

## 2022-01-09 ENCOUNTER — Encounter: Payer: Self-pay | Admitting: Cardiology

## 2022-01-09 ENCOUNTER — Ambulatory Visit: Payer: Medicare HMO | Admitting: Cardiology

## 2022-01-09 VITALS — BP 130/62 | HR 85 | Ht 63.0 in | Wt 147.2 lb

## 2022-01-09 DIAGNOSIS — E782 Mixed hyperlipidemia: Secondary | ICD-10-CM | POA: Diagnosis not present

## 2022-01-09 DIAGNOSIS — I10 Essential (primary) hypertension: Secondary | ICD-10-CM | POA: Diagnosis not present

## 2022-01-09 DIAGNOSIS — I2510 Atherosclerotic heart disease of native coronary artery without angina pectoris: Secondary | ICD-10-CM

## 2022-01-09 DIAGNOSIS — I6523 Occlusion and stenosis of bilateral carotid arteries: Secondary | ICD-10-CM

## 2022-01-09 NOTE — Progress Notes (Signed)
Clinical Summary Ms. Polter is a 76 y.o.female seen today for follow up of the following medical problems.   1. CAD - previous PCI 09/2008 for UA, received DES to LCX - history of prior CABG in 2000 at [facility].  - 09/2015 [place] nuclear stress: no ischemia, LVEF 69%       05/2021 echo: LVEF 60-65%, no WMAs, grade I diastolic dysfunction, normal RV function 05/2021 nuclear stress: no ischemia   - no chest pain.SOB with some improvement - compliant with meds - uses push mower x 30 minutes without exertional symptosm.        2. Hyperlipidemia - muscle aches on lipitor,she is on pravastatin and tolerating well  04/2021 TC 180 TG 194 HDL 46 LDL 101  - upcoming labs with pcp       3. SOB - former smoker x 40 years - worked in [employer] in the past.    3. Carotid stenosis - mild bilateral disease 2011  - carotid US 01/2016 with mild bilateral disease.  05/2021 carotid US: mild plaque without stenosis     4. PAD - 1999 had aortobifem bypass. Followed by vascular with appt 04/2016, recs to f/u only as needed.   - no claudication symptoms    5. HTN - she is compliant with meds      SH:. 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 Past Medical History:  Diagnosis Date   Anxiety and depression    CAD (coronary artery disease)    PCI, March, 2010  /   nuclear, July, 2010, no scar or ischemia   Carotid artery disease ([place])    Doppler November, 2011, mild atherosclerotic disease, less than [id number] stenosis  LICA,  no significant stenosis or ICA,, vertebral is antegrade   Dyslipidemia    GERD (gastroesophageal reflux disease)    Hx of CABG    2000   Hx of carpal tunnel syndrome    Hypertension    PAD (peripheral artery disease) ([place])    Aortobifem bypass, January, 1999   Schatzki's ring    Rourk   Shortness of breath      Allergies  Allergen Reactions   Penicillins      Current Outpatient Medications  Medication Sig Dispense Refill   amLODipine  (NORVASC) 5 MG tablet Take 5 mg by mouth daily.  6   aspirin 81 MG tablet Take 81 mg by mouth daily.     buPROPion (WELLBUTRIN XL) 150 MG 24 hr tablet Take 150 mg by mouth daily.     clonazePAM (KLONOPIN) 0.5 MG tablet Take 1 mg by mouth at bedtime.      losartan-hydrochlorothiazide (HYZAAR) 50-12.5 MG tablet Take 1 tablet by mouth daily.  3   Multiple Vitamin (MULTI-VITAMIN) tablet Take by mouth.     nitroGLYCERIN (NITROSTAT) 0.4 MG SL tablet Place 1 tablet (0.4 mg total) under the tongue every 5 (five) minutes as needed. 25 tablet 3   pravastatin (PRAVACHOL) 40 MG tablet Take 2 tablets (80 mg total) by mouth daily.     No current facility-administered medications for this visit.     Past Surgical History:  Procedure Laterality Date   AORTOBIFEMORAL BYPASS GRAFTING  1999   CORONARY ARTERY BYPASS GRAFT  2000   CYST REMOVAL FROM BREAST     LEFT   HEMORRHOID SURGERY     HERNIA REPAIR     TUBAL LIGATION       Allergies  Allergen Reactions   Penicillins  Family History  Problem Relation Age of Onset   Heart attack Sister        deceased   Heart attack Brother 48       deceased     Social History Ms. Kim reports that she quit smoking about 24 years ago. Her smoking use included cigarettes. She smoked an average of 1 pack per day. She has never used smokeless tobacco. Ms. Gorum reports no history of alcohol use.   Review of Systems CONSTITUTIONAL: No weight loss, fever, chills, weakness or fatigue.  HEENT: Eyes: No visual loss, blurred vision, double vision or yellow sclerae.No hearing loss, sneezing, congestion, runny nose or sore throat.  SKIN: No rash or itching.  CARDIOVASCULAR: per hpi RESPIRATORY: No shortness of breath, cough or sputum.  GASTROINTESTINAL: No anorexia, nausea, vomiting or diarrhea. No abdominal pain or blood.  GENITOURINARY: No burning on urination, no polyuria NEUROLOGICAL: No headache, dizziness, syncope, paralysis, ataxia,  numbness or tingling in the extremities. No change in bowel or bladder control.  MUSCULOSKELETAL: No muscle, back pain, joint pain or stiffness.  LYMPHATICS: No enlarged nodes. No history of splenectomy.  PSYCHIATRIC: No history of depression or anxiety.  ENDOCRINOLOGIC: No reports of sweating, cold or heat intolerance. No polyuria or polydipsia.  Marland Kitchen   Physical Examination Today's Vitals   01/09/22 1336  BP: 130/62  Pulse: 85  SpO2: 96%  Weight: 147 lb 3.2 oz (66.8 kg)  Height: 5\' 3"  (1.6 m)   Body mass index is 26.08 kg/m.  Gen: resting comfortably, no acute distress HEENT: no scleral icterus, pupils equal round and reactive, no palptable cervical adenopathy,  CV: RRR, no m/r/g, no jvd Resp: Clear to auscultation bilaterally GI: abdomen is soft, non-tender, non-distended, normal bowel sounds, no hepatosplenomegaly MSK: extremities are warm, no edema.  Skin: warm, no rash Neuro:  no focal deficits Psych: appropriate affect   Diagnostic Studies  09/2008 Cath RESULTS:  Left main coronary artery:  The left main coronary artery was  free of significant disease.    Left anterior descending artery:  The left anterior ascending artery had  90% stenosis located just after the takeoff of the first septal  perforator and first diagonal Kayana Thoen.  There was competing flow in the  first diagonal Mayra Jolliffe and there was 90% ostial stenosis in the first  diagonal Rudine Rieger.    Circumflex artery:  The circumflex artery gave rise to an atrial Leanda Padmore  and then had a 99% stenosis feeding a marginal Florian Chauca.    Right coronary artery:  The right coronary artery had some  irregularities in its midportion.  The posterior descending Marajade Lei  appeared to have a subbranch that was totally occluded, although it  appeared likely that this was a small vessel.    The saphenous vein graft to the right coronary artery was completely  occluded at its origin.    The LIMA graft to the LAD was atretic and  possibly completely occluded  at its midportion.  It certainly was functionally occluded.     09/2008 Cath Invertention RESULTS:  Initially stenosis in the mid circumflex artery was 99% with  TIMI II flow.  Following stenting, this improved to 0% with TIMI III  flow.    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    DISPOSITION:  The patient returned to post angio room for further  observation.       05/2021 echo   IMPRESSIONS     1. Left ventricular ejection fraction, by estimation, is 60 to 65%. The  left ventricle has normal function. The left ventricle has no regional  wall motion abnormalities. Left ventricular diastolic parameters are  consistent with Grade I diastolic  dysfunction (impaired relaxation).   2. Right ventricular systolic function is normal. The right ventricular  size is normal. Tricuspid regurgitation signal is inadequate for assessing  PA pressure.   3. The mitral valve is normal in structure. No evidence of mitral valve  regurgitation. No evidence of mitral stenosis.   4. The aortic valve is tricuspid. Aortic valve regurgitation is not  visualized. No aortic stenosis is present.   5. The inferior vena cava is normal in size with greater than 50%  respiratory variability, suggesting right atrial pressure of 3 mmHg.   05/2021 carotid US IMPRESSION: Color duplex indicates minimal heterogeneous and calcified plaque, with no hemodynamically significant stenosis by duplex criteria in the extracranial cerebrovascular circulation.   Assessment and Plan  1. CAD - no symptoms, continue current meds - chronic SOB/DOE with recent benign cardiac workup, if recurrent would plan for PFTs     2. Hyperlipidemia - continue pravastatin, f/u upcoming pcp labs - if not at goal  consider trying crestor, did not tolerate lipitor    3. HTN - at goal, continue current meds  4. Carotid stenosis - mild by recent US, continue to monitor      Antoine Poche, M.D.

## 2022-01-09 NOTE — Patient Instructions (Signed)
Medication Instructions:  Continue all current medications.   Labwork: none  Testing/Procedures: none  Follow-Up: 6 months   Any Other Special Instructions Will Be Listed Below (If Applicable).   If you need a refill on your cardiac medications before your next appointment, please call your pharmacy.  

## 2022-01-14 DIAGNOSIS — Z1231 Encounter for screening mammogram for malignant neoplasm of breast: Secondary | ICD-10-CM | POA: Diagnosis not present

## 2022-02-09 DIAGNOSIS — K120 Recurrent oral aphthae: Secondary | ICD-10-CM | POA: Diagnosis not present

## 2022-04-01 DIAGNOSIS — Z6826 Body mass index (BMI) 26.0-26.9, adult: Secondary | ICD-10-CM | POA: Diagnosis not present

## 2022-04-01 DIAGNOSIS — I10 Essential (primary) hypertension: Secondary | ICD-10-CM | POA: Diagnosis not present

## 2022-04-01 DIAGNOSIS — F419 Anxiety disorder, unspecified: Secondary | ICD-10-CM | POA: Diagnosis not present

## 2022-04-01 DIAGNOSIS — Z299 Encounter for prophylactic measures, unspecified: Secondary | ICD-10-CM | POA: Diagnosis not present

## 2022-04-01 DIAGNOSIS — Z713 Dietary counseling and surveillance: Secondary | ICD-10-CM | POA: Diagnosis not present

## 2022-05-09 ENCOUNTER — Encounter: Payer: Self-pay | Admitting: Cardiology

## 2022-05-09 DIAGNOSIS — E559 Vitamin D deficiency, unspecified: Secondary | ICD-10-CM | POA: Diagnosis not present

## 2022-05-09 DIAGNOSIS — Z1339 Encounter for screening examination for other mental health and behavioral disorders: Secondary | ICD-10-CM | POA: Diagnosis not present

## 2022-05-09 DIAGNOSIS — Z7189 Other specified counseling: Secondary | ICD-10-CM | POA: Diagnosis not present

## 2022-05-09 DIAGNOSIS — R5383 Other fatigue: Secondary | ICD-10-CM | POA: Diagnosis not present

## 2022-05-09 DIAGNOSIS — Z79899 Other long term (current) drug therapy: Secondary | ICD-10-CM | POA: Diagnosis not present

## 2022-05-09 DIAGNOSIS — Z0000 Encounter for general adult medical examination without abnormal findings: Secondary | ICD-10-CM | POA: Diagnosis not present

## 2022-05-09 DIAGNOSIS — Z299 Encounter for prophylactic measures, unspecified: Secondary | ICD-10-CM | POA: Diagnosis not present

## 2022-05-09 DIAGNOSIS — Z1331 Encounter for screening for depression: Secondary | ICD-10-CM | POA: Diagnosis not present

## 2022-05-09 DIAGNOSIS — I10 Essential (primary) hypertension: Secondary | ICD-10-CM | POA: Diagnosis not present

## 2022-05-09 DIAGNOSIS — Z6827 Body mass index (BMI) 27.0-27.9, adult: Secondary | ICD-10-CM | POA: Diagnosis not present

## 2022-05-09 DIAGNOSIS — Z23 Encounter for immunization: Secondary | ICD-10-CM | POA: Diagnosis not present

## 2022-05-09 DIAGNOSIS — E7800 Pure hypercholesterolemia, unspecified: Secondary | ICD-10-CM | POA: Diagnosis not present

## 2022-06-30 DIAGNOSIS — F419 Anxiety disorder, unspecified: Secondary | ICD-10-CM | POA: Diagnosis not present

## 2022-06-30 DIAGNOSIS — Z6827 Body mass index (BMI) 27.0-27.9, adult: Secondary | ICD-10-CM | POA: Diagnosis not present

## 2022-06-30 DIAGNOSIS — Z299 Encounter for prophylactic measures, unspecified: Secondary | ICD-10-CM | POA: Diagnosis not present

## 2022-06-30 DIAGNOSIS — Z713 Dietary counseling and surveillance: Secondary | ICD-10-CM | POA: Diagnosis not present

## 2022-06-30 DIAGNOSIS — I10 Essential (primary) hypertension: Secondary | ICD-10-CM | POA: Diagnosis not present

## 2022-07-17 ENCOUNTER — Ambulatory Visit: Payer: Medicare HMO | Attending: Cardiology | Admitting: Cardiology

## 2022-07-17 ENCOUNTER — Encounter: Payer: Self-pay | Admitting: Cardiology

## 2022-07-17 VITALS — BP 124/68 | HR 84 | Ht 62.0 in | Wt 140.8 lb

## 2022-07-17 DIAGNOSIS — I10 Essential (primary) hypertension: Secondary | ICD-10-CM | POA: Diagnosis not present

## 2022-07-17 DIAGNOSIS — I2510 Atherosclerotic heart disease of native coronary artery without angina pectoris: Secondary | ICD-10-CM

## 2022-07-17 DIAGNOSIS — E782 Mixed hyperlipidemia: Secondary | ICD-10-CM | POA: Diagnosis not present

## 2022-07-17 NOTE — Progress Notes (Signed)
Clinical Summary Cathy Sharp is a 76 y.o.female seen today for follow up of the following medical problems.      1. CAD - previous PCI 09/2008 for UA, received DES to LCX - history of prior CABG in 2000 at [facility].  - 09/2015 [place] nuclear stress: no ischemia, LVEF 69%       05/2021 echo: LVEF 60-65%, no WMAs, grade I diastolic dysfunction, normal RV function 05/2021 nuclear stress: no ischemia    - no chest pains, SOB continues to improves - she is compliant with meds       2. Hyperlipidemia - muscle aches on lipitor,she is on pravastatin and tolerating well  04/2021 TC 180 TG 194 HDL 46 LDL 101  - upcoming labs with pcp   04/2022 TC 137 TG 169 HDL 47 LDL 62.      3. SOB - former smoker x 40 years - worked in [employer] in the past.      3. Carotid stenosis - mild bilateral disease 2011  - carotid US 01/2016 with mild bilateral disease.  05/2021 carotid US: mild plaque without stenosis     4. PAD - 1999 had aortobifem bypass. Followed by vascular with appt 04/2016, recs to f/u only as needed.   - denies any claudications symptoms.        5. HTN - she is compliant with meds   6. CKD     SH:. 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 Past Medical History:  Diagnosis Date   Anxiety and depression    CAD (coronary artery disease)    PCI, March, 2010  /   nuclear, July, 2010, no scar or ischemia   Carotid artery disease ([facility])    Doppler November, 2011, mild atherosclerotic disease, less than 50% stenosis  LICA,  no significant stenosis or ICA,, vertebral is antegrade   Dyslipidemia    GERD (gastroesophageal reflux disease)    Hx of CABG    2000   Hx of carpal tunnel syndrome    Hypertension    PAD (peripheral artery disease) ([facility])    Aortobifem bypass, January, 1999   Schatzki's ring    Rourk   Shortness of breath      Allergies  Allergen Reactions   Penicillins      Current Outpatient Medications  Medication Sig Dispense  Refill   amLODipine (NORVASC) 5 MG tablet Take 5 mg by mouth daily.  6   aspirin 81 MG tablet Take 81 mg by mouth daily.     buPROPion (WELLBUTRIN XL) 150 MG 24 hr tablet Take 150 mg by mouth daily.     Cholecalciferol (VITAMIN D3) 250 MCG (10000 UT) capsule Take 10,000 Units by mouth daily.     clonazePAM (KLONOPIN) 0.5 MG tablet Take 1 mg by mouth at bedtime.      losartan-hydrochlorothiazide (HYZAAR) 50-12.5 MG tablet Take 1 tablet by mouth daily.  3   Multiple Vitamin (MULTI-VITAMIN) tablet Take by mouth.     nitroGLYCERIN (NITROSTAT) 0.4 MG SL tablet Place 1 tablet (0.4 mg total) under the tongue every 5 (five) minutes as needed. (Patient not taking: Reported on 01/09/2022) 25 tablet 3   pravastatin (PRAVACHOL) 40 MG tablet Take 2 tablets (80 mg total) by mouth daily.     No current facility-administered medications for this visit.     Past Surgical History:  Procedure Laterality Date   AORTOBIFEMORAL BYPASS GRAFTING  1999   CORONARY ARTERY BYPASS GRAFT  2000   CYST REMOVAL FROM BREAST     LEFT   HEMORRHOID SURGERY     HERNIA REPAIR     TUBAL LIGATION       Allergies  Allergen Reactions   Penicillins       Family History  Problem Relation Age of Onset   Heart attack Sister        deceased   Heart attack Brother 35       deceased     Social History Cathy Sharp reports that she quit smoking about 24 years ago. Her smoking use included cigarettes. She smoked an average of 1 pack per day. She has never used smokeless tobacco. Cathy Sharp reports no history of alcohol use.   Review of Systems CONSTITUTIONAL: No weight loss, fever, chills, weakness or fatigue.  HEENT: Eyes: No visual loss, blurred vision, double vision or yellow sclerae.No hearing loss, sneezing, congestion, runny nose or sore throat.  SKIN: No rash or itching.  CARDIOVASCULAR: per hpi RESPIRATORY: No shortness of breath, cough or sputum.  GASTROINTESTINAL: No anorexia, nausea, vomiting or  diarrhea. No abdominal pain or blood.  GENITOURINARY: No burning on urination, no polyuria NEUROLOGICAL: No headache, dizziness, syncope, paralysis, ataxia, numbness or tingling in the extremities. No change in bowel or bladder control.  MUSCULOSKELETAL: No muscle, back pain, joint pain or stiffness.  LYMPHATICS: No enlarged nodes. No history of splenectomy.  PSYCHIATRIC: No history of depression or anxiety.  ENDOCRINOLOGIC: No reports of sweating, cold or heat intolerance. No polyuria or polydipsia.  Marland Kitchen   Physical Examination Today's Vitals   07/17/22 1523  BP: 124/68  Pulse: 84  SpO2: 98%  Weight: 140 lb 12.8 oz (63.9 kg)  Height: 5\' 2"  (1.575 m)   Body mass index is 25.75 kg/m.  Gen: resting comfortably, no acute distress HEENT: no scleral icterus, pupils equal round and reactive, no palptable cervical adenopathy,  CV: RRR, no m/r/g, no jvd Resp: Clear to auscultation bilaterally GI: abdomen is soft, non-tender, non-distended, normal bowel sounds, no hepatosplenomegaly MSK: extremities are warm, no edema.  Skin: warm, no rash Neuro:  no focal deficits Psych: appropriate affect   Diagnostic Studies  09/2008 Cath RESULTS:  Left main coronary artery:  The left main coronary artery was  free of significant disease.    Left anterior descending artery:  The left anterior ascending artery had  90% stenosis located just after the takeoff of the first septal  perforator and first diagonal [place] F[place].  There was competing flow in the  first diagonal Rasheida Broden and there was 90% ostial stenosis in the first  diagonal Eilam Shrewsbury.    Circumflex artery:  The circumflex artery gave rise to an atrial Aniaya Bacha  and then had a 99% stenosis feeding a marginal [place] Silveria.    Right coronary artery:  The right coronary artery had some  irregularities in its midportion.  The posterior descending Dez Stauffer  appeared to have a subbranch that was totally occluded, although it  appeared likely that this was a  small vessel.    The saphenous vein graft to the right coronary artery was completely  occluded at its origin.    The LIMA graft to the LAD was atretic and possibly completely occluded  at its midportion.  It certainly was functionally occluded.     09/2008 Cath Invertention RESULTS:  Initially stenosis in the mid circumflex artery was 99% with  TIMI II flow.  Following stenting, this improved to 0% with TIMI III  flow.  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    DISPOSITION:  The patient returned to post angio room for further  observation.       05/2021 echo   IMPRESSIONS     1. Left ventricular ejection fraction, by estimation, is 60 to 65%. The  left ventricle has normal function. The left ventricle has no regional  wall motion abnormalities. Left ventricular diastolic parameters are  consistent with Grade I diastolic  dysfunction (impaired relaxation).   2. Right ventricular systolic function is normal. The right ventricular  size is normal. Tricuspid regurgitation signal is inadequate for assessing  PA pressure.   3. The mitral valve is normal in structure. No evidence of mitral valve  regurgitation. No evidence of mitral stenosis.   4. The aortic valve is tricuspid. Aortic valve regurgitation is not  visualized. No aortic stenosis is present.   5. The inferior vena cava is normal in size with greater than 50%  respiratory variability, suggesting right atrial pressure of 3 mmHg.    05/2021 carotid US IMPRESSION: Color duplex indicates minimal heterogeneous and calcified plaque, with no hemodynamically significant stenosis by duplex criteria in the extracranial cerebrovascular circulation.   Assessment and Plan   1. CAD - no recent symptoms, cotninue current meds  EKG  today SR, chronic ST/T changes     2. Hyperlipidemia -at goal, continue current meds      3. HTN - bp at goal, continue current meds       Arnoldo Lenis, M.D.

## 2022-07-17 NOTE — Patient Instructions (Addendum)
Medication Instructions:  Nitroglycerin refilled today Continue all other medications.     Labwork: none  Testing/Procedures: none  Follow-Up: 6 months   Any Other Special Instructions Will Be Listed Below (If Applicable).   If you need a refill on your cardiac medications before your next appointment, please call your pharmacy.  

## 2022-07-23 ENCOUNTER — Telehealth: Payer: Self-pay | Admitting: Cardiology

## 2022-07-23 MED ORDER — NITROGLYCERIN 0.4 MG SL SUBL: 0.4000 mg | SUBLINGUAL_TABLET | SUBLINGUAL | 3 refills | Status: AC | PRN

## 2022-07-23 NOTE — Telephone Encounter (Signed)
*  STAT* If patient is at the pharmacy, call can be transferred to refill team.   1. Which medications need to be refilled? (please list name of each medication and dose if known) nitroGLYCERIN (NITROSTAT) 0.4 MG SL tablet  2. Which pharmacy/location (including street and city if local pharmacy) is medication to be sent to? [facility] [address], [place] - [address]   3. Do they need a 30 day or 90 day supply? 30

## 2022-07-23 NOTE — Telephone Encounter (Signed)
Refilled as requested  

## 2022-09-26 DIAGNOSIS — N183 Chronic kidney disease, stage 3 unspecified: Secondary | ICD-10-CM | POA: Diagnosis not present

## 2022-09-26 DIAGNOSIS — Z87891 Personal history of nicotine dependence: Secondary | ICD-10-CM | POA: Diagnosis not present

## 2022-09-26 DIAGNOSIS — Z6827 Body mass index (BMI) 27.0-27.9, adult: Secondary | ICD-10-CM | POA: Diagnosis not present

## 2022-09-26 DIAGNOSIS — M25511 Pain in right shoulder: Secondary | ICD-10-CM | POA: Diagnosis not present

## 2022-09-26 DIAGNOSIS — I779 Disorder of arteries and arterioles, unspecified: Secondary | ICD-10-CM | POA: Diagnosis not present

## 2022-09-26 DIAGNOSIS — I10 Essential (primary) hypertension: Secondary | ICD-10-CM | POA: Diagnosis not present

## 2022-09-26 DIAGNOSIS — Z299 Encounter for prophylactic measures, unspecified: Secondary | ICD-10-CM | POA: Diagnosis not present

## 2022-09-26 DIAGNOSIS — Z0000 Encounter for general adult medical examination without abnormal findings: Secondary | ICD-10-CM | POA: Diagnosis not present

## 2022-09-26 DIAGNOSIS — I739 Peripheral vascular disease, unspecified: Secondary | ICD-10-CM | POA: Diagnosis not present

## 2022-09-29 DIAGNOSIS — M25512 Pain in left shoulder: Secondary | ICD-10-CM | POA: Diagnosis not present

## 2022-09-29 DIAGNOSIS — M25511 Pain in right shoulder: Secondary | ICD-10-CM | POA: Diagnosis not present

## 2022-10-03 DIAGNOSIS — M7552 Bursitis of left shoulder: Secondary | ICD-10-CM | POA: Diagnosis not present

## 2022-10-03 DIAGNOSIS — I10 Essential (primary) hypertension: Secondary | ICD-10-CM | POA: Diagnosis not present

## 2022-10-03 DIAGNOSIS — Z299 Encounter for prophylactic measures, unspecified: Secondary | ICD-10-CM | POA: Diagnosis not present

## 2022-12-29 DIAGNOSIS — F419 Anxiety disorder, unspecified: Secondary | ICD-10-CM | POA: Diagnosis not present

## 2022-12-29 DIAGNOSIS — G4700 Insomnia, unspecified: Secondary | ICD-10-CM | POA: Diagnosis not present

## 2022-12-29 DIAGNOSIS — I10 Essential (primary) hypertension: Secondary | ICD-10-CM | POA: Diagnosis not present

## 2022-12-29 DIAGNOSIS — Z299 Encounter for prophylactic measures, unspecified: Secondary | ICD-10-CM | POA: Diagnosis not present

## 2023-03-17 ENCOUNTER — Ambulatory Visit: Payer: Medicare HMO | Attending: Cardiology | Admitting: Cardiology

## 2023-03-17 VITALS — BP 118/68 | HR 65 | Ht 63.0 in | Wt 141.6 lb

## 2023-03-17 DIAGNOSIS — I10 Essential (primary) hypertension: Secondary | ICD-10-CM

## 2023-03-17 DIAGNOSIS — E782 Mixed hyperlipidemia: Secondary | ICD-10-CM

## 2023-03-17 DIAGNOSIS — I2510 Atherosclerotic heart disease of native coronary artery without angina pectoris: Secondary | ICD-10-CM | POA: Diagnosis not present

## 2023-03-17 NOTE — Progress Notes (Signed)
Clinical Summary Cathy Sharp is a 77 y.o.female seen today for follow up of the following medical problems.        1. CAD - previous PCI 09/2008 for UA, received DES to LCX - history of prior CABG in 2000 at [facility].  - 09/2015 [place] nuclear stress: no ischemia, LVEF 69%       05/2021 echo: LVEF 60-65%, no WMAs, grade I diastolic dysfunction, normal RV function 05/2021 nuclear stress: no ischemia    - episode 2 weeks ago left chest aching pain, not positional. No other associated symptoms. Took NG, resolved after 5-10 minutes. Uses push mower x 40 minute with break in middle, as recent as yesterday.  - compliant with meds       2. Hyperlipidemia - muscle aches on lipitor,she is on pravastatin and tolerating well  04/2021 TC 180 TG 194 HDL 46 LDL 101  - upcoming labs with pcp   04/2022 TC 137 TG 169 HDL 47 LDL 62.  - upcoming labs with pcp        3. Carotid stenosis - mild bilateral disease 2011  - carotid US 01/2016 with mild bilateral disease.  05/2021 carotid US: mild plaque without stenosis     4. PAD - 1999 had aortobifem bypass. Followed by vascular with appt 04/2016, recs to f/u only as needed.  - no claudication pains       5. HTN - she is compliant with meds   6. CKD     SH:. 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 Past Medical History:  Diagnosis Date   Anxiety and depression    CAD (coronary artery disease)    PCI, March, 2010  /   nuclear, July, 2010, no scar or ischemia   Carotid artery disease (HCC)    Doppler November, 2011, mild atherosclerotic disease, less than 50% stenosis  LICA,  no significant stenosis or ICA,, vertebral is antegrade   Dyslipidemia    GERD (gastroesophageal reflux disease)    Hx of CABG    2000   Hx of carpal tunnel syndrome    Hypertension    PAD (peripheral artery disease) (HCC)    Aortobifem bypass, January, 1999   Schatzki's ring    Rourk   Shortness of breath      Allergies  Allergen  Reactions   Penicillins      Current Outpatient Medications  Medication Sig Dispense Refill   amLODipine (NORVASC) 5 MG tablet Take 5 mg by mouth daily.  6   aspirin 81 MG tablet Take 81 mg by mouth daily.     buPROPion (WELLBUTRIN XL) 150 MG 24 hr tablet Take 150 mg by mouth daily.     Cholecalciferol (VITAMIN D3) 250 MCG (10000 UT) capsule Take 10,000 Units by mouth daily.     clonazePAM (KLONOPIN) 0.5 MG tablet Take 1 mg by mouth at bedtime.      losartan-hydrochlorothiazide (HYZAAR) 50-12.5 MG tablet Take 1 tablet by mouth daily.  3   Multiple Vitamin (MULTI-VITAMIN) tablet Take by mouth. (Patient not taking: Reported on 07/17/2022)     nitroGLYCERIN (NITROSTAT) 0.4 MG SL tablet Place 1 tablet (0.4 mg total) under the tongue every 5 (five) minutes as needed. 25 tablet 3   pravastatin (PRAVACHOL) 40 MG tablet Take 2 tablets (80 mg total) by mouth daily.     No current facility-administered medications for this visit.     Past Surgical History:  Procedure Laterality Date  AORTOBIFEMORAL BYPASS GRAFTING  1999   CORONARY ARTERY BYPASS GRAFT  2000   CYST REMOVAL FROM BREAST     LEFT   HEMORRHOID SURGERY     HERNIA REPAIR     TUBAL LIGATION       Allergies  Allergen Reactions   Penicillins       Family History  Problem Relation Age of Onset   Heart attack Sister        deceased   Heart attack Brother 61       deceased     Social History Ms. Bendorf reports that she quit smoking about 25 years ago. Her smoking use included cigarettes. She has never used smokeless tobacco. Ms. Ilyas reports no history of alcohol use.   Review of Systems CONSTITUTIONAL: No weight loss, fever, chills, weakness or fatigue.  HEENT: Eyes: No visual loss, blurred vision, double vision or yellow sclerae.No hearing loss, sneezing, congestion, runny nose or sore throat.  SKIN: No rash or itching.  CARDIOVASCULAR: per hpi RESPIRATORY: No shortness of breath, cough or sputum.   GASTROINTESTINAL: No anorexia, nausea, vomiting or diarrhea. No abdominal pain or blood.  GENITOURINARY: No burning on urination, no polyuria NEUROLOGICAL: No headache, dizziness, syncope, paralysis, ataxia, numbness or tingling in the extremities. No change in bowel or bladder control.  MUSCULOSKELETAL: No muscle, back pain, joint pain or stiffness.  LYMPHATICS: No enlarged nodes. No history of splenectomy.  PSYCHIATRIC: No history of depression or anxiety.  ENDOCRINOLOGIC: No reports of sweating, cold or heat intolerance. No polyuria or polydipsia.  Marland Kitchen   Physical Examination Today's Vitals   03/17/23 0920  BP: 118/68  Pulse: 65  SpO2: 99%  Weight: 141 lb 9.6 oz (64.2 kg)  Height: 5\' 3"  (1.6 m)   Body mass index is 25.08 kg/m.  Gen: resting comfortably, no acute distress HEENT: no scleral icterus, pupils equal round and reactive, no palptable cervical adenopathy,  CV: RRR, no m/rg, no jvd Resp: Clear to auscultation bilaterally GI: abdomen is soft, non-tender, non-distended, normal bowel sounds, no hepatosplenomegaly MSK: extremities are warm, no edema.  Skin: warm, no rash Neuro:  no focal deficits Psych: appropriate affect   Diagnostic Studies  09/2008 Cath RESULTS:  Left main coronary artery:  The left main coronary artery was  free of significant disease.    Left anterior descending artery:  The left anterior ascending artery had  90% stenosis located just after the takeoff of the first septal  perforator and first diagonal Moua Rasmusson.  There was competing flow in the  first diagonal Primo Innis and there was 90% ostial stenosis in the first  diagonal Kareem Cathey.    Circumflex artery:  The circumflex artery gave rise to an atrial Sofie Schendel  and then had a 99% stenosis feeding a marginal Sacramento Monds.    Right coronary artery:  The right coronary artery had some  irregularities in its midportion.  The posterior descending Ticia Virgo  appeared to have a subbranch that was totally occluded,  although it  appeared likely that this was a small vessel.    The saphenous vein graft to the right coronary artery was completely  occluded at its origin.    The LIMA graft to the LAD was atretic and possibly completely occluded  at its midportion.  It certainly was functionally occluded.     09/2008 Cath Invertention RESULTS:  Initially stenosis in the mid circumflex artery was 99% with  TIMI II flow.  Following stenting, this improved to 0% with TIMI III  flow.    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    DISPOSITION:  The patient returned to post angio room for further  observation.       05/2021 echo   IMPRESSIONS     1. Left ventricular ejection fraction, by estimation, is 60 to 65%. The  left ventricle has normal function. The left ventricle has no regional  wall motion abnormalities. Left ventricular diastolic parameters are  consistent with Grade I diastolic  dysfunction (impaired relaxation).   2. Right ventricular systolic function is normal. The right ventricular  size is normal. Tricuspid regurgitation signal is inadequate for assessing  PA pressure.   3. The mitral valve is normal in structure. No evidence of mitral valve  regurgitation. No evidence of mitral stenosis.   4. The aortic valve is tricuspid. Aortic valve regurgitation is not  visualized. No aortic stenosis is present.   5. The inferior vena cava is normal in size with greater than 50%  respiratory variability, suggesting right atrial pressure of 3 mmHg.    05/2021 carotid US IMPRESSION: Color duplex indicates minimal heterogeneous and calcified plaque, with no hemodynamically significant stenosis by duplex criteria in the extracranial cerebrovascular circulation.   Assessment and Plan   1. CAD -  no symptoms, continue current meds     2. Hyperlipidemia -continue current meds, upcoming labs with pcp. Would aim for LDL <55      3. HTN - at goal, continue current meds  F/u 6 months     Antoine Poche, M.D.

## 2023-03-17 NOTE — Patient Instructions (Signed)
Medication Instructions:  Continue all current medications.   Labwork: none  Testing/Procedures: none  Follow-Up: 6 months   Any Other Special Instructions Will Be Listed Below (If Applicable).   If you need a refill on your cardiac medications before your next appointment, please call your pharmacy.  

## 2023-03-26 DIAGNOSIS — I10 Essential (primary) hypertension: Secondary | ICD-10-CM | POA: Diagnosis not present

## 2023-03-26 DIAGNOSIS — F419 Anxiety disorder, unspecified: Secondary | ICD-10-CM | POA: Diagnosis not present

## 2023-03-26 DIAGNOSIS — Z299 Encounter for prophylactic measures, unspecified: Secondary | ICD-10-CM | POA: Diagnosis not present

## 2023-05-07 ENCOUNTER — Other Ambulatory Visit (INDEPENDENT_AMBULATORY_CARE_PROVIDER_SITE_OTHER): Payer: Medicare HMO

## 2023-05-07 ENCOUNTER — Ambulatory Visit: Payer: Medicare HMO | Admitting: Orthopaedic Surgery

## 2023-05-07 ENCOUNTER — Encounter: Payer: Self-pay | Admitting: Orthopaedic Surgery

## 2023-05-07 DIAGNOSIS — M25562 Pain in left knee: Secondary | ICD-10-CM | POA: Diagnosis not present

## 2023-05-07 DIAGNOSIS — M170 Bilateral primary osteoarthritis of knee: Secondary | ICD-10-CM | POA: Diagnosis not present

## 2023-05-07 DIAGNOSIS — M25561 Pain in right knee: Secondary | ICD-10-CM

## 2023-05-07 MED ORDER — BUPIVACAINE HCL 0.25 % IJ SOLN
4.0000 mL | INTRAMUSCULAR | Status: AC | PRN
  Administered 2023-05-07: 4 mL via INTRA_ARTICULAR

## 2023-05-07 MED ORDER — BUPIVACAINE HCL 0.5 % IJ SOLN
3.0000 mL | INTRAMUSCULAR | Status: AC | PRN
  Administered 2023-05-07: 3 mL via INTRA_ARTICULAR

## 2023-05-07 MED ORDER — METHYLPREDNISOLONE ACETATE 40 MG/ML IJ SUSP
40.0000 mg | INTRAMUSCULAR | Status: AC | PRN
Start: 2023-05-07 — End: 2023-05-07
  Administered 2023-05-07: 40 mg via INTRA_ARTICULAR

## 2023-05-07 MED ORDER — LIDOCAINE HCL 1 % IJ SOLN
0.5000 mL | INTRAMUSCULAR | Status: AC | PRN
  Administered 2023-05-07: .5 mL

## 2023-05-07 MED ORDER — METHYLPREDNISOLONE ACETATE 40 MG/ML IJ SUSP
40.0000 mg | INTRAMUSCULAR | Status: AC | PRN
  Administered 2023-05-07: 40 mg via INTRA_ARTICULAR

## 2023-05-07 NOTE — Progress Notes (Signed)
Office Visit Note   Patient: Cathy Sharp           Date of Birth: [birth year]           MRN: [id number] Visit Date: 05/07/2023              Requested by: Kirstie Peri, MD [address] [place],  [place] [address] PCP: Kirstie Peri, MD   Assessment & Plan: Visit Diagnoses:  1. Pain in both knees, unspecified chronicity   2. Bilateral primary osteoarthritis of knee     Plan: She can recheck will follow-up in 1 month.  She can cancel if her knees are doing great.  Follow-Up Instructions: No follow-ups on file.   Orders:  Orders Placed This Encounter  Procedures   Large Joint Inj   Large Joint Inj   XR Knee 1-2 Views Left   XR Knee 1-2 Views Right   No orders of the defined types were placed in this encounter.     Procedures: Large Joint Inj: R knee on 05/07/2023 2:40 PM Indications: pain and joint swelling Details: 22 G 1.5 in needle, anterolateral approach  Arthrogram: No  Medications: 40 mg methylPREDNISolone acetate 40 MG/ML; 0.5 mL lidocaine 1 %; 4 mL bupivacaine 0.25 % Outcome: tolerated well, no immediate complications Procedure, treatment alternatives, risks and benefits explained, specific risks discussed. Consent was given by the patient. Immediately prior to procedure a time out was called to verify the correct patient, procedure, equipment, support staff and site/side marked as required. Patient was prepped and draped in the usual sterile fashion.    Large Joint Inj: L knee on 05/07/2023 2:40 PM Indications: joint swelling and pain Details: 22 G 1.5 in needle, anterolateral approach  Arthrogram: No  Medications: 0.5 mL lidocaine 1 %; 3 mL bupivacaine 0.5 %; 40 mg methylPREDNISolone acetate 40 MG/ML Outcome: tolerated well, no immediate complications Procedure, treatment alternatives, risks and benefits explained, specific risks discussed. Consent was given by the patient. Immediately prior to procedure a time out was called to verify the correct patient,  procedure, equipment, support staff and site/side marked as required. Patient was prepped and draped in the usual sterile fashion.       Clinical Data: No additional findings.   Subjective: Chief Complaint  Patient presents with   Right Knee - Pain    R > L has been using braces and it helped with pain doesn't help anymore   Left Knee - Pain    HPI 77 year old female returns with bilateral knee pain worse on the right than left regular knee out straight for the last few days.  She uses knee braces she mows the lawn with a push mower.  Previous injection right knee several years ago gave her some relief.  Review of Systems all other systems noncontributory to HPI.  She does have coronary artery disease previous cardiac surgery anxiety depression carpal tunnel hypertension and GERD.   Objective: Vital Signs: There were no vitals taken for this visit.  Physical Exam Constitutional:      Appearance: She is well-developed.  HENT:     Head: Normocephalic.     Right Ear: External ear normal.     Left Ear: External ear normal. There is no impacted cerumen.  Eyes:     Pupils: Pupils are equal, round, and reactive to light.  Neck:     Thyroid: No thyromegaly.     Trachea: No tracheal deviation.  Cardiovascular:     Rate and Rhythm: Normal  rate.  Pulmonary:     Effort: Pulmonary effort is normal.  Abdominal:     Palpations: Abdomen is soft.  Musculoskeletal:     Cervical back: No rigidity.  Skin:    General: Skin is warm and dry.  Neurological:     Mental Status: She is alert and oriented to person, place, and time.  Psychiatric:        Behavior: Behavior normal.     Ortho Exam medial more than lateral joint line pain she lacks 8 degrees reaching full extension on the right left comes full extension both knees flex past 110 degrees.  Specialty Comments:  No specialty comments available.  Imaging: XR Knee 1-2 Views Right  Result Date: 05/07/2023 Standing AP both  knees lateral right knee obtained and reviewed this shows primarily medial compartment arthritis right knee and patellofemoral spurring.  2 mm medial joint space is noted with subchondral sclerosis and small marginal osteophytes. Impression: Right knee osteoarthritis primarily medial compartment.  XR Knee 1-2 Views Left  Result Date: 05/07/2023 Standing AP both knees lateral left knee demonstrates medial more than lateral compartment osteoarthritis with 2 mm joint space.  Some patellofemoral degenerative changes.  Previous saphenous vein harvest clips noted in the subcutaneous tissue. Impression: Left knee osteoarthritis primarily medial compartment.    PMFS History: Patient Active Problem List   Diagnosis Date Noted   Bilateral primary osteoarthritis of knee 10/04/2020   Hypertension    CAD (coronary artery disease)    PAD (peripheral artery disease) (HCC)    GERD (gastroesophageal reflux disease)    Anxiety and depression    Hx of carpal tunnel syndrome    Shortness of breath    Hx of CABG    Dyslipidemia    Schatzki's ring    Carotid artery disease (HCC)    GERD 10/17/2009   Past Medical History:  Diagnosis Date   Anxiety and depression    CAD (coronary artery disease)    PCI, March, 2010  /   nuclear, July, 2010, no scar or ischemia   Carotid artery disease (HCC)    Doppler November, 2011, mild atherosclerotic disease, less than 50% stenosis  LICA,  no significant stenosis or ICA,, vertebral is antegrade   Dyslipidemia    GERD (gastroesophageal reflux disease)    Hx of CABG    2000   Hx of carpal tunnel syndrome    Hypertension    PAD (peripheral artery disease) (HCC)    Aortobifem bypass, January, 1999   Schatzki's ring    Rourk   Shortness of breath     Family History  Problem Relation Age of Onset   Heart attack Sister        deceased   Heart attack Brother 26       deceased    Past Surgical History:  Procedure Laterality Date   AORTOBIFEMORAL BYPASS  GRAFTING  1999   CORONARY ARTERY BYPASS GRAFT  2000   CYST REMOVAL FROM BREAST     LEFT   HEMORRHOID SURGERY     HERNIA REPAIR     TUBAL LIGATION     Social History   Occupational History   Occupation: UNEMPLOYED  Tobacco Use   Smoking status: Former    Current packs/day: 0.00    Types: Cigarettes    Quit date: 07/28/1997    Years since quitting: 25.7   Smokeless tobacco: Never   Tobacco comments:    Year Quit: 1999  Vaping Use   Vaping status:  Never Used  Substance and Sexual Activity   Alcohol use: No    Alcohol/week: 0.0 standard drinks of alcohol   Drug use: No   Sexual activity: Not on file

## 2023-05-12 DIAGNOSIS — R5383 Other fatigue: Secondary | ICD-10-CM | POA: Diagnosis not present

## 2023-05-12 DIAGNOSIS — Z79899 Other long term (current) drug therapy: Secondary | ICD-10-CM | POA: Diagnosis not present

## 2023-05-12 DIAGNOSIS — I10 Essential (primary) hypertension: Secondary | ICD-10-CM | POA: Diagnosis not present

## 2023-05-12 DIAGNOSIS — Z299 Encounter for prophylactic measures, unspecified: Secondary | ICD-10-CM | POA: Diagnosis not present

## 2023-05-12 DIAGNOSIS — Z7189 Other specified counseling: Secondary | ICD-10-CM | POA: Diagnosis not present

## 2023-05-12 DIAGNOSIS — Z1339 Encounter for screening examination for other mental health and behavioral disorders: Secondary | ICD-10-CM | POA: Diagnosis not present

## 2023-05-12 DIAGNOSIS — Z23 Encounter for immunization: Secondary | ICD-10-CM | POA: Diagnosis not present

## 2023-05-12 DIAGNOSIS — E7800 Pure hypercholesterolemia, unspecified: Secondary | ICD-10-CM | POA: Diagnosis not present

## 2023-05-12 DIAGNOSIS — E559 Vitamin D deficiency, unspecified: Secondary | ICD-10-CM | POA: Diagnosis not present

## 2023-05-12 DIAGNOSIS — Z1331 Encounter for screening for depression: Secondary | ICD-10-CM | POA: Diagnosis not present

## 2023-05-12 DIAGNOSIS — Z0000 Encounter for general adult medical examination without abnormal findings: Secondary | ICD-10-CM | POA: Diagnosis not present

## 2023-05-18 DIAGNOSIS — Z299 Encounter for prophylactic measures, unspecified: Secondary | ICD-10-CM | POA: Diagnosis not present

## 2023-05-18 DIAGNOSIS — I10 Essential (primary) hypertension: Secondary | ICD-10-CM | POA: Diagnosis not present

## 2023-05-18 DIAGNOSIS — N1832 Chronic kidney disease, stage 3b: Secondary | ICD-10-CM | POA: Diagnosis not present

## 2023-05-21 DIAGNOSIS — Z1231 Encounter for screening mammogram for malignant neoplasm of breast: Secondary | ICD-10-CM | POA: Diagnosis not present

## 2023-06-21 DIAGNOSIS — S0181XA Laceration without foreign body of other part of head, initial encounter: Secondary | ICD-10-CM | POA: Diagnosis not present

## 2023-06-21 DIAGNOSIS — S0510XA Contusion of eyeball and orbital tissues, unspecified eye, initial encounter: Secondary | ICD-10-CM | POA: Diagnosis not present

## 2023-06-21 DIAGNOSIS — S50812A Abrasion of left forearm, initial encounter: Secondary | ICD-10-CM | POA: Diagnosis not present

## 2023-06-21 DIAGNOSIS — S41111A Laceration without foreign body of right upper arm, initial encounter: Secondary | ICD-10-CM | POA: Diagnosis not present

## 2023-06-21 DIAGNOSIS — Z87891 Personal history of nicotine dependence: Secondary | ICD-10-CM | POA: Diagnosis not present

## 2023-06-21 DIAGNOSIS — R519 Headache, unspecified: Secondary | ICD-10-CM | POA: Diagnosis not present

## 2023-06-21 DIAGNOSIS — S50811A Abrasion of right forearm, initial encounter: Secondary | ICD-10-CM | POA: Diagnosis not present

## 2023-06-21 DIAGNOSIS — I2510 Atherosclerotic heart disease of native coronary artery without angina pectoris: Secondary | ICD-10-CM | POA: Diagnosis not present

## 2023-06-21 DIAGNOSIS — Z880 Allergy status to penicillin: Secondary | ICD-10-CM | POA: Diagnosis not present

## 2023-06-21 DIAGNOSIS — Z043 Encounter for examination and observation following other accident: Secondary | ICD-10-CM | POA: Diagnosis not present

## 2023-06-21 DIAGNOSIS — I10 Essential (primary) hypertension: Secondary | ICD-10-CM | POA: Diagnosis not present

## 2023-06-24 DIAGNOSIS — T148XXA Other injury of unspecified body region, initial encounter: Secondary | ICD-10-CM | POA: Diagnosis not present

## 2023-06-24 DIAGNOSIS — N1832 Chronic kidney disease, stage 3b: Secondary | ICD-10-CM | POA: Diagnosis not present

## 2023-06-24 DIAGNOSIS — I10 Essential (primary) hypertension: Secondary | ICD-10-CM | POA: Diagnosis not present

## 2023-06-24 DIAGNOSIS — I779 Disorder of arteries and arterioles, unspecified: Secondary | ICD-10-CM | POA: Diagnosis not present

## 2023-06-24 DIAGNOSIS — Z299 Encounter for prophylactic measures, unspecified: Secondary | ICD-10-CM | POA: Diagnosis not present

## 2023-07-13 DIAGNOSIS — I10 Essential (primary) hypertension: Secondary | ICD-10-CM | POA: Diagnosis not present

## 2023-07-13 DIAGNOSIS — K5900 Constipation, unspecified: Secondary | ICD-10-CM | POA: Diagnosis not present

## 2023-07-13 DIAGNOSIS — Z299 Encounter for prophylactic measures, unspecified: Secondary | ICD-10-CM | POA: Diagnosis not present

## 2023-08-03 DIAGNOSIS — Z299 Encounter for prophylactic measures, unspecified: Secondary | ICD-10-CM | POA: Diagnosis not present

## 2023-08-03 DIAGNOSIS — I10 Essential (primary) hypertension: Secondary | ICD-10-CM | POA: Diagnosis not present

## 2023-08-03 DIAGNOSIS — U071 COVID-19: Secondary | ICD-10-CM | POA: Diagnosis not present

## 2023-08-03 DIAGNOSIS — J069 Acute upper respiratory infection, unspecified: Secondary | ICD-10-CM | POA: Diagnosis not present

## 2023-08-12 IMAGING — US US CAROTID DUPLEX BILAT
1 series · 13 of 24 positions shown · non-contrast
Comparison: None.

CLINICAL DATA: 75-year-old female with carotid stenosis

EXAM:
BILATERAL CAROTID DUPLEX ULTRASOUND
TECHNIQUE: Gray scale imaging, color Doppler and duplex ultrasound were
performed of bilateral carotid and vertebral arteries in the neck.

[Series 1: us carotid duplex bilat · 0.06mm/px · 13 of 68 slices shown]
[im 1/68]
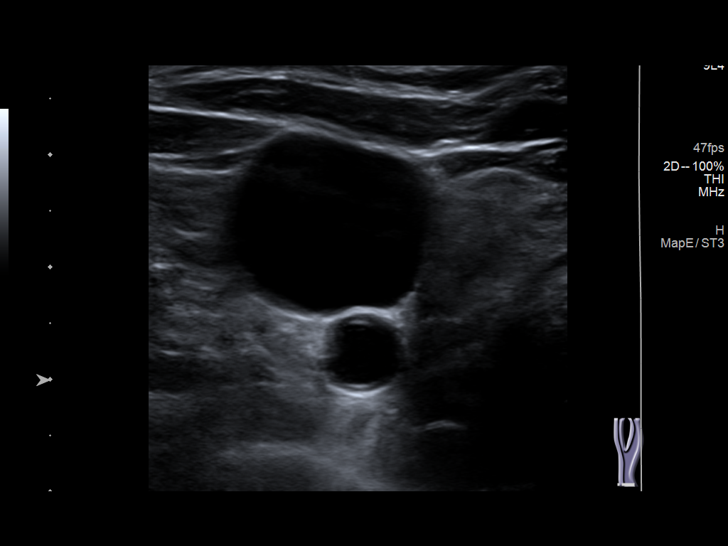
[im 6/68]
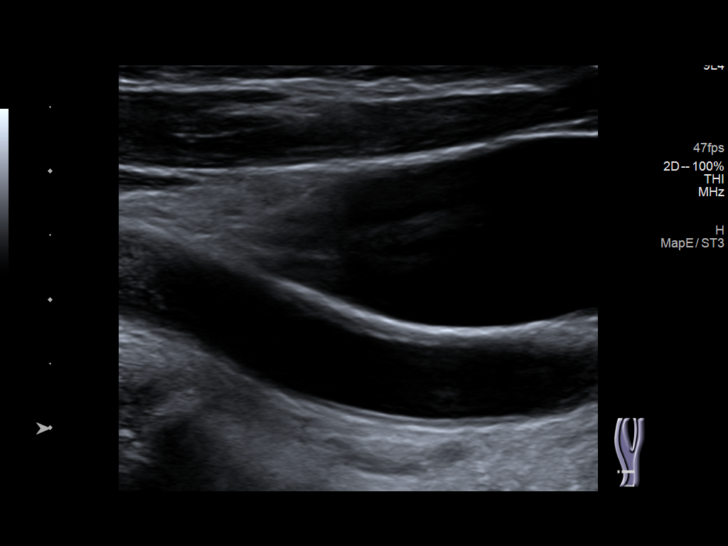
[im 12/68]
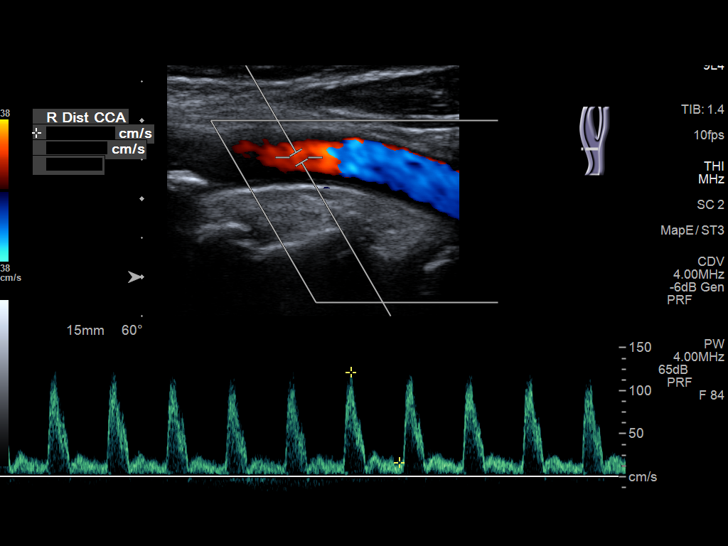
[im 18/68]
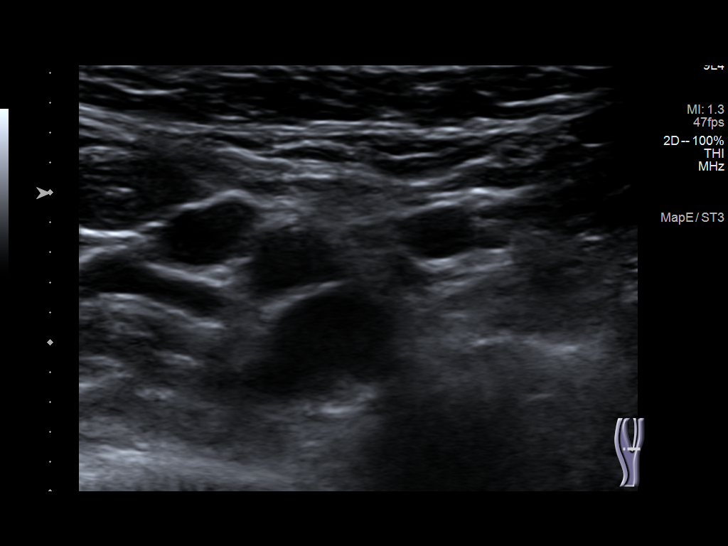
[im 24/68]
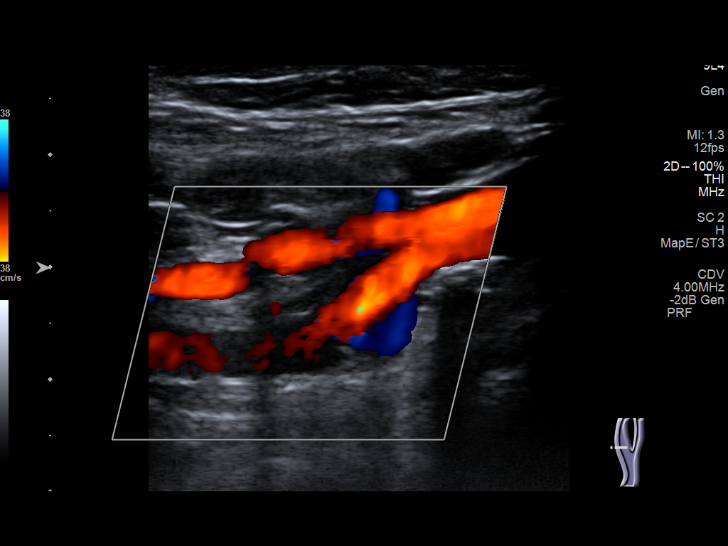
[im 30/68]
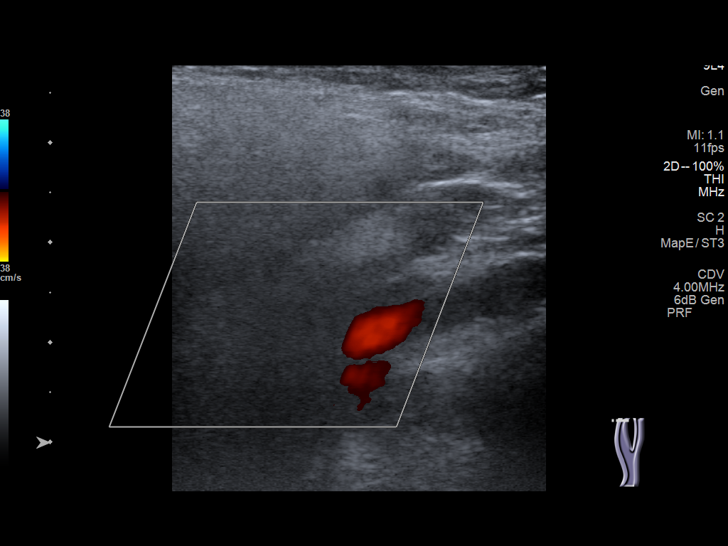
[im 35/68]
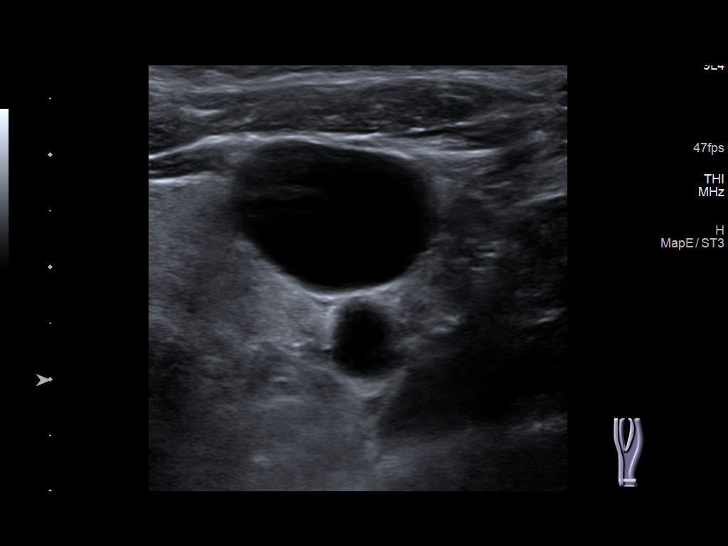
[im 38/68]
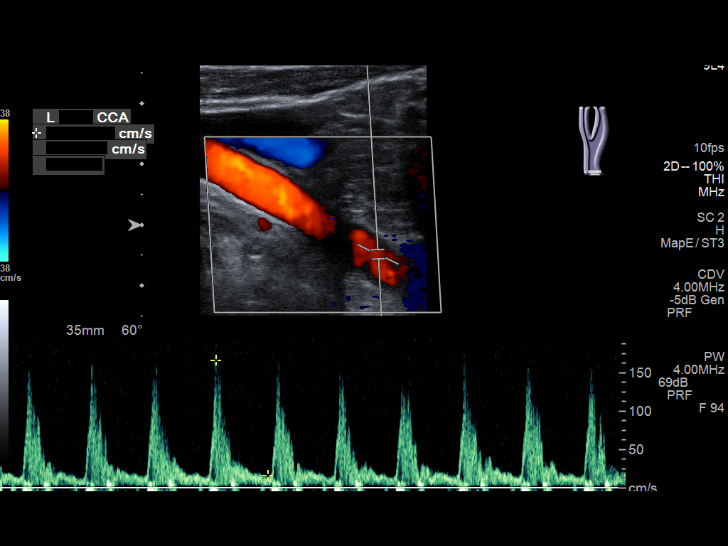
[im 44/68]
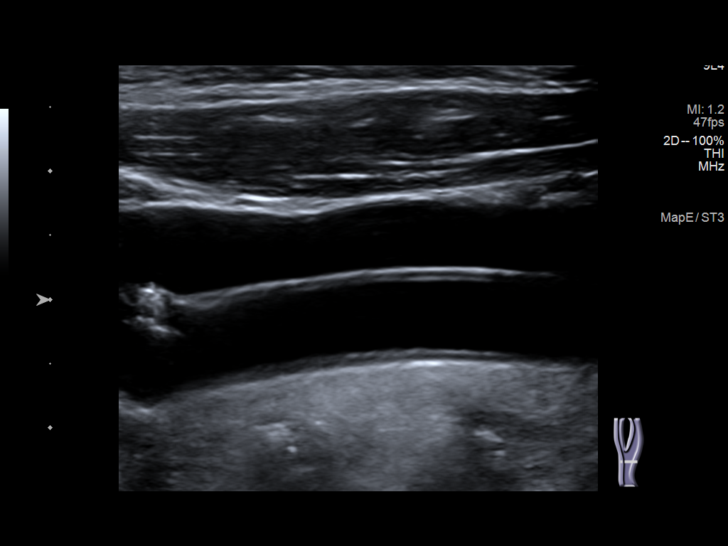
[im 50/68]
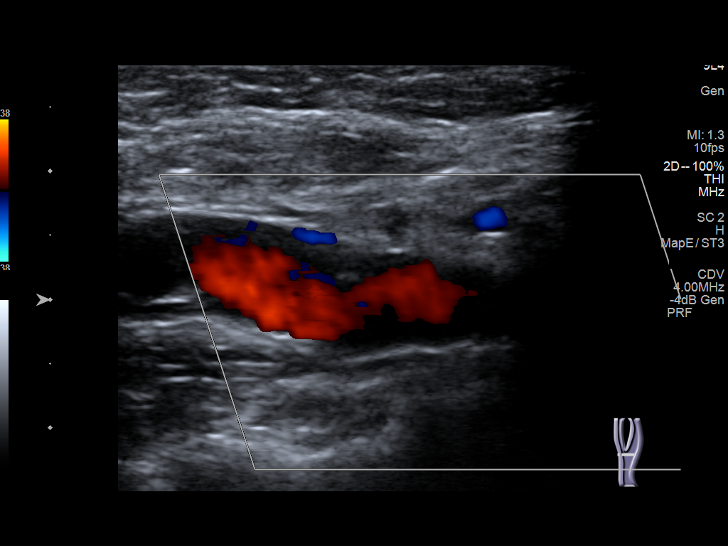
[im 56/68]
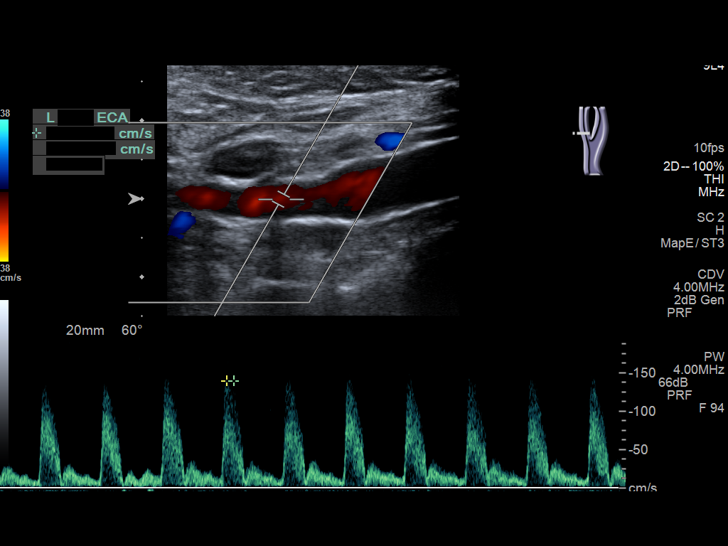
[im 62/68]
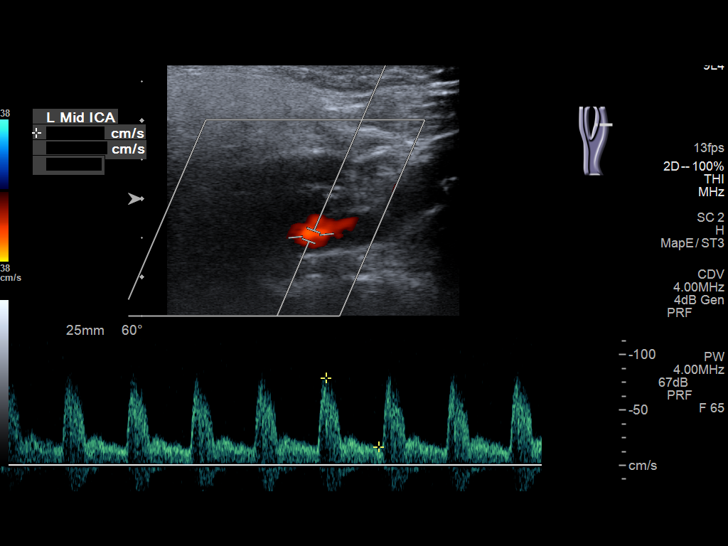
[im 68/68]
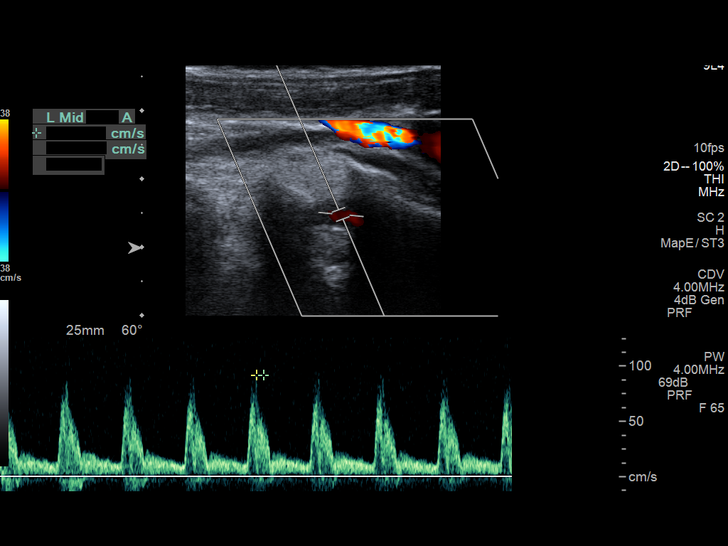

[13 of 24 positions shown; findings below may reference images not displayed]

FINDINGS: Criteria: Quantification of carotid stenosis is based on velocity
parameters that correlate the residual internal carotid diameter
with NASCET-based stenosis levels, using the diameter of the distal
internal carotid lumen as the denominator for stenosis measurement.

The following velocity measurements were obtained:

RIGHT

ICA:  Systolic 102 cm/sec, Diastolic 17 cm/sec

CCA:  127 cm/sec

SYSTOLIC ICA/CCA RATIO:

ECA:  143 cm/sec

LEFT

ICA:  Systolic 92 cm/sec, Diastolic 23 cm/sec

CCA:  129 cm/sec

SYSTOLIC ICA/CCA RATIO:

ECA:  140 cm/sec

Right Brachial SBP: Not acquired

Left Brachial SBP: Not acquired

RIGHT CAROTID ARTERY: No significant calcifications of the right
common carotid artery. Intermediate waveform maintained.
Heterogeneous and partially calcified plaque at the right carotid
bifurcation. No significant lumen shadowing. Low resistance waveform
of the right ICA. No significant tortuosity.

RIGHT VERTEBRAL ARTERY: Antegrade flow with low resistance waveform.

LEFT CAROTID ARTERY: No significant calcifications of the left
common carotid artery. Intermediate waveform maintained.
Heterogeneous and partially calcified plaque at the left carotid
bifurcation without significant lumen shadowing. Low resistance
waveform of the left ICA. No significant tortuosity.

LEFT VERTEBRAL ARTERY:  Antegrade flow with low resistance waveform.
IMPRESSION: Color duplex indicates minimal heterogeneous and calcified plaque,
with no hemodynamically significant stenosis by duplex criteria in
the extracranial cerebrovascular circulation.

## 2023-09-21 ENCOUNTER — Encounter: Payer: Self-pay | Admitting: Cardiology

## 2023-09-21 ENCOUNTER — Ambulatory Visit: Payer: Medicare (Managed Care) | Attending: Cardiology | Admitting: Cardiology

## 2023-09-21 VITALS — BP 132/80 | HR 84 | Ht 63.0 in | Wt 141.8 lb

## 2023-09-21 DIAGNOSIS — I10 Essential (primary) hypertension: Secondary | ICD-10-CM

## 2023-09-21 DIAGNOSIS — E782 Mixed hyperlipidemia: Secondary | ICD-10-CM

## 2023-09-21 DIAGNOSIS — I2510 Atherosclerotic heart disease of native coronary artery without angina pectoris: Secondary | ICD-10-CM | POA: Diagnosis not present

## 2023-09-21 NOTE — Patient Instructions (Signed)
 Medication Instructions:  Continue all current medications.   Labwork: none  Testing/Procedures: none  Follow-Up: 6 months   Any Other Special Instructions Will Be Listed Below (If Applicable).   If you need a refill on your cardiac medications before your next appointment, please call your pharmacy.

## 2023-09-21 NOTE — Progress Notes (Signed)
 Clinical Summary Cathy Sharp is a 78 y.o.female seen today for follow up of the following medical problems.       1. CAD - previous PCI 09/2008 for UA, received DES to LCX - history of prior CABG in 2000 at [facility].  - 09/2015 [place] nuclear stress: no ischemia, LVEF 69%     05/2021 echo: LVEF 60-65%, no WMAs, grade I diastolic dysfunction, normal RV function 05/2021 nuclear stress: no ischemia    - no chest pains, no SOB/DOE. - compliant with meds       2. Hyperlipidemia - muscle aches on lipitor,she is on pravastatin and tolerating well  04/2021 TC 180 TG 194 HDL 46 LDL 101  04/2022 TC 137 TG 169 HDL 47 LDL 62.  - 04/2023 TC 132 TG 242 HDL 47 LDL 47       3. Carotid stenosis - mild bilateral disease 2011  - carotid US 01/2016 with mild bilateral disease.  05/2021 carotid US: mild plaque without stenosis     4. PAD - 1999 had aortobifem bypass. Followed by vascular with appt 04/2016, recs to f/u only as needed.  - denies any claudication like symptoms.        5. HTN - she is compliant with meds   6. CKD  - followed by pcp   SH:. 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, great great grand 9 months ago    Past Medical History:  Diagnosis Date   Anxiety and depression    CAD (coronary artery disease)    PCI, March, 2010  /   nuclear, July, 2010, no scar or ischemia   Carotid artery disease (HCC)    Doppler November, 2011, mild atherosclerotic disease, less than 50% stenosis  LICA,  no significant stenosis or ICA,, vertebral is antegrade   Dyslipidemia    GERD (gastroesophageal reflux disease)    Hx of CABG    2000   Hx of carpal tunnel syndrome    Hypertension    PAD (peripheral artery disease) (HCC)    Aortobifem bypass, January, 1999   Schatzki's ring    Rourk   Shortness of breath      Allergies  Allergen Reactions   Penicillins      Current Outpatient Medications  Medication Sig Dispense Refill   amLODipine (NORVASC) 5 MG  tablet Take 5 mg by mouth daily.  6   aspirin 81 MG tablet Take 81 mg by mouth daily.     buPROPion (WELLBUTRIN XL) 150 MG 24 hr tablet Take 150 mg by mouth daily.     Cholecalciferol (VITAMIN D3) 250 MCG (10000 UT) capsule Take 10,000 Units by mouth daily.     clonazePAM (KLONOPIN) 0.5 MG tablet Take 1 mg by mouth at bedtime.      losartan-hydrochlorothiazide (HYZAAR) 50-12.5 MG tablet Take 1 tablet by mouth daily.  3   Multiple Vitamin (MULTI-VITAMIN) tablet Take by mouth. (Patient not taking: Reported on 07/17/2022)     nitroGLYCERIN (NITROSTAT) 0.4 MG SL tablet Place 1 tablet (0.4 mg total) under the tongue every 5 (five) minutes as needed. 25 tablet 3   pravastatin (PRAVACHOL) 40 MG tablet Take 2 tablets (80 mg total) by mouth daily.     No current facility-administered medications for this visit.     Past Surgical History:  Procedure Laterality Date   AORTOBIFEMORAL BYPASS GRAFTING  1999   CORONARY ARTERY BYPASS GRAFT  2000   CYST REMOVAL FROM BREAST  LEFT   HEMORRHOID SURGERY     HERNIA REPAIR     TUBAL LIGATION       Allergies  Allergen Reactions   Penicillins       Family History  Problem Relation Age of Onset   Heart attack Sister        deceased   Heart attack Brother 49       deceased     Social History Ms. Kulzer reports that she quit smoking about 26 years ago. Her smoking use included cigarettes. She has never used smokeless tobacco. Ms. Selvy reports no history of alcohol use.     Physical Examination Today's Vitals   09/21/23 1303  BP: 132/80  Pulse: 84  SpO2: 93%  Weight: 141 lb 12.8 oz (64.3 kg)  Height: 5\' 3"  (1.6 m)   Body mass index is 25.12 kg/m.  Gen: resting comfortably, no acute distress HEENT: no scleral icterus, pupils equal round and reactive, no palptable cervical adenopathy,  CV: RRR, no mrg, no jvd Resp: Clear to auscultation bilaterally GI: abdomen is soft, non-tender, non-distended, normal bowel sounds, no  hepatosplenomegaly MSK: extremities are warm, no edema.  Skin: warm, no rash Neuro:  no focal deficits Psych: appropriate affect   Diagnostic Studies  09/2008 Cath RESULTS:  Left main coronary artery:  The left main coronary artery was  free of significant disease.    Left anterior descending artery:  The left anterior ascending artery had  90% stenosis located just after the takeoff of the first septal  perforator and first diagonal Leshaun Biebel.  There was competing flow in the  first diagonal Olesya Wike and there was 90% ostial stenosis in the first  diagonal Holiday Mcmenamin.    Circumflex artery:  The circumflex artery gave rise to an atrial Keaundra Stehle  and then had a 99% stenosis feeding a marginal Khayree Delellis.    Right coronary artery:  The right coronary artery had some  irregularities in its midportion.  The posterior descending Isrrael Fluckiger  appeared to have a subbranch that was totally occluded, although it  appeared likely that this was a small vessel.    The saphenous vein graft to the right coronary artery was completely  occluded at its origin.    The LIMA graft to the LAD was atretic and possibly completely occluded  at its midportion.  It certainly was functionally occluded.     09/2008 Cath Invertention RESULTS:  Initially stenosis in the mid circumflex artery was 99% with  TIMI II flow.  Following stenting, this improved to 0% with TIMI III  flow.    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    DISPOSITION:  The patient returned to post angio room for further  observation.       05/2021 echo   IMPRESSIONS     1. Left ventricular ejection fraction, by estimation, is 60 to 65%. The  left ventricle has normal function. The left ventricle has no regional  wall motion abnormalities. Left  ventricular diastolic parameters are  consistent with Grade I diastolic  dysfunction (impaired relaxation).   2. Right ventricular systolic function is normal. The right ventricular  size is normal. Tricuspid regurgitation signal is inadequate for assessing  PA pressure.   3. The mitral valve is normal in structure. No evidence of  mitral valve  regurgitation. No evidence of mitral stenosis.   4. The aortic valve is tricuspid. Aortic valve regurgitation is not  visualized. No aortic stenosis is present.   5. The inferior vena cava is normal in size with greater than 50%  respiratory variability, suggesting right atrial pressure of 3 mmHg.    05/2021 carotid US IMPRESSION: Color duplex indicates minimal heterogeneous and calcified plaque, with no hemodynamically significant stenosis by duplex criteria in the extracranial cerebrovascular circulation.   Assessment and Plan   1. CAD - no symptoms - EKG today shows SR, no acute ischemic changes     2. Hyperlipidemia -LDL at goal, discussed dietary changes to lower TGs - continue current meds      3. HTN - bp is at goal, continue current meds  F/u 6 months   Antoine Poche, M.D.

## 2023-12-15 DIAGNOSIS — Z1331 Encounter for screening for depression: Secondary | ICD-10-CM | POA: Diagnosis not present

## 2023-12-15 DIAGNOSIS — I779 Disorder of arteries and arterioles, unspecified: Secondary | ICD-10-CM | POA: Diagnosis not present

## 2023-12-15 DIAGNOSIS — Z299 Encounter for prophylactic measures, unspecified: Secondary | ICD-10-CM | POA: Diagnosis not present

## 2023-12-15 DIAGNOSIS — Z1339 Encounter for screening examination for other mental health and behavioral disorders: Secondary | ICD-10-CM | POA: Diagnosis not present

## 2023-12-15 DIAGNOSIS — N183 Chronic kidney disease, stage 3 unspecified: Secondary | ICD-10-CM | POA: Diagnosis not present

## 2023-12-15 DIAGNOSIS — I739 Peripheral vascular disease, unspecified: Secondary | ICD-10-CM | POA: Diagnosis not present

## 2023-12-15 DIAGNOSIS — Z7189 Other specified counseling: Secondary | ICD-10-CM | POA: Diagnosis not present

## 2023-12-15 DIAGNOSIS — Z0000 Encounter for general adult medical examination without abnormal findings: Secondary | ICD-10-CM | POA: Diagnosis not present

## 2024-03-08 DIAGNOSIS — Z299 Encounter for prophylactic measures, unspecified: Secondary | ICD-10-CM | POA: Diagnosis not present

## 2024-03-08 DIAGNOSIS — E2839 Other primary ovarian failure: Secondary | ICD-10-CM | POA: Diagnosis not present

## 2024-03-08 DIAGNOSIS — I10 Essential (primary) hypertension: Secondary | ICD-10-CM | POA: Diagnosis not present

## 2024-03-10 DIAGNOSIS — H01004 Unspecified blepharitis left upper eyelid: Secondary | ICD-10-CM | POA: Diagnosis not present

## 2024-03-10 DIAGNOSIS — H25812 Combined forms of age-related cataract, left eye: Secondary | ICD-10-CM | POA: Diagnosis not present

## 2024-03-10 DIAGNOSIS — H01002 Unspecified blepharitis right lower eyelid: Secondary | ICD-10-CM | POA: Diagnosis not present

## 2024-03-10 DIAGNOSIS — H01001 Unspecified blepharitis right upper eyelid: Secondary | ICD-10-CM | POA: Diagnosis not present

## 2024-04-22 DIAGNOSIS — H25812 Combined forms of age-related cataract, left eye: Secondary | ICD-10-CM | POA: Diagnosis not present

## 2024-04-25 ENCOUNTER — Encounter (HOSPITAL_COMMUNITY): Payer: Self-pay

## 2024-04-25 ENCOUNTER — Encounter (HOSPITAL_COMMUNITY)
Admission: RE | Admit: 2024-04-25 | Discharge: 2024-04-25 | Disposition: A | Discharge status: Home / Self Care | Payer: Medicare (Managed Care) | Source: Ambulatory Visit | Attending: Ophthalmology | Admitting: Ophthalmology

## 2024-04-25 ENCOUNTER — Other Ambulatory Visit: Payer: Self-pay

## 2024-04-25 HISTORY — DX: Unspecified osteoarthritis, unspecified site: M19.90

## 2024-04-26 NOTE — H&P (Signed)
 Surgical History & Physical  Patient Name: Cathy Sharp  DOB: 1946/03/26  Surgery: Cataract extraction with intraocular lens implant phacoemulsification; Left Eye Surgeon: Lynwood Hermann MD Surgery Date: 04/29/2024 Pre-Op Date: 03/10/2024  HPI: A 59 Yr. old female patient 1.  The patient complains of difficulty when driving, which began many years ago. The left eye is affected. The episode is gradual. The patient describes glare and hazy symptoms affecting their eyes/vision. The condition's severity decreased since last visit. This is negatively affecting the patient's quality of life and the patient is unable to function adequately in life with the current level of vision. The patient experiences no flashes, floater, shadow, curtain or veil. Uses OTC readers. Had cataract surgery in the right eye several years ago. No new flashes or floaters noticed. Gtts: Pataday ou prn HPI was performed by Lynwood Hermann .  Medical History: Dry Eyes Cataracts  Arthritis Heart Problem High Blood Pressure LDL  Review of Systems Cardiovascular High Blood Pressure, High Cholesterol : hx 4 blockages Musculoskeletal Joint Ache Psychiatry Depression All recorded systems are negative except as noted above.  Social Former smoker    Medication Prednisolone-moxiflox-bromfen,  Amlodipine , Clonazepam, Pravastatin , Bupropion HCl, Losartan-hydrochlorothiazide, Nitroglycerin , Vitamin D3 250mcg, Aspirin  Sx/Procedures Phaco c IOL OD ZCBOO +21.5,  Open heart surgery x4 stents, Bypass Surgery on legs  Drug Allergies  penicillin   History & Physical: Heent: cataract NECK: supple without bruits LUNGS: lungs clear to auscultation CV: regular rate and rhythm Abdomen: soft and non-tender  Impression & Plan: Assessment: 1.  COMBINED FORMS AGE RELATED CATARACT; Left Eye (H25.812) 2.  BLEPHARITIS; Right Upper Lid, Right Lower Lid, Left Upper Lid, Left Lower Lid (H01.001, H01.002,H01.004,H01.005) 3.   DERMATOCHALASIS, no surgery; Right Upper Lid, Left Upper Lid (H02.831, H02.834) 4.  INTRAOCULAR LENS IOL (Z96.1)  Plan: 1.  Cataract accounts for the patient's decreased vision. This visual impairment is not correctable with a tolerable change in glasses or contact lenses. Cataract surgery with an implantation of a new lens should significantly improve the visual and functional status of the patient. Recommend phacoemulsification with intraocular lens. Discussed all risks, benefits, alternatives, and potential complications. Discussed the procedures and recovery. The patient desires to have surgery. A-scan/Biometry ordered and will be performed for intraocular lens calculations. The surgery will be performed in order to improve vision for driving, reading, and for eye examinations. Recommend Dextenza for post-operative pain and inflammation. Educational materials provided: Cataract. History of corneal refractive Surgery: None History of Previous Ocular Surgery (PPV, other): None History of ocular trauma: None Use of Eye Pressure Lowering Drops: None Pupil Status: Dilates poorly - shugarcaine or Lidocaine +Omidira by protocol, Malyugin Ring Left Eye. Refractive Goal: [place] Standard IOL.  2.  Blepharitis is present - recommend regular lid cleaning.  3.  Asymptomatic, recommend observation for now. Findings, prognosis and treatment options reviewed.  4.  Doing well since surgery

## 2024-04-29 ENCOUNTER — Ambulatory Visit (HOSPITAL_COMMUNITY)
Admission: RE | Admit: 2024-04-29 | Discharge: 2024-04-29 | Disposition: A | Discharge status: Home / Self Care | Payer: Medicare (Managed Care) | Attending: Ophthalmology | Admitting: Ophthalmology

## 2024-04-29 ENCOUNTER — Ambulatory Visit (HOSPITAL_COMMUNITY): Payer: Medicare (Managed Care) | Admitting: Anesthesiology

## 2024-04-29 ENCOUNTER — Other Ambulatory Visit: Payer: Self-pay

## 2024-04-29 ENCOUNTER — Encounter (HOSPITAL_COMMUNITY)
Admission: RE | Disposition: A | Discharge status: Home / Self Care | Payer: Self-pay | Source: Home / Self Care | Attending: Ophthalmology

## 2024-04-29 ENCOUNTER — Encounter (HOSPITAL_COMMUNITY): Payer: Self-pay | Admitting: Ophthalmology

## 2024-04-29 DIAGNOSIS — H2181 Floppy iris syndrome: Secondary | ICD-10-CM | POA: Insufficient documentation

## 2024-04-29 DIAGNOSIS — K219 Gastro-esophageal reflux disease without esophagitis: Secondary | ICD-10-CM | POA: Diagnosis not present

## 2024-04-29 DIAGNOSIS — I2510 Atherosclerotic heart disease of native coronary artery without angina pectoris: Secondary | ICD-10-CM

## 2024-04-29 DIAGNOSIS — H25812 Combined forms of age-related cataract, left eye: Secondary | ICD-10-CM | POA: Diagnosis not present

## 2024-04-29 DIAGNOSIS — Z951 Presence of aortocoronary bypass graft: Secondary | ICD-10-CM | POA: Insufficient documentation

## 2024-04-29 DIAGNOSIS — H5712 Ocular pain, left eye: Secondary | ICD-10-CM | POA: Diagnosis not present

## 2024-04-29 DIAGNOSIS — I10 Essential (primary) hypertension: Secondary | ICD-10-CM

## 2024-04-29 DIAGNOSIS — F32A Depression, unspecified: Secondary | ICD-10-CM | POA: Diagnosis not present

## 2024-04-29 DIAGNOSIS — Z87891 Personal history of nicotine dependence: Secondary | ICD-10-CM | POA: Insufficient documentation

## 2024-04-29 DIAGNOSIS — F419 Anxiety disorder, unspecified: Secondary | ICD-10-CM | POA: Diagnosis not present

## 2024-04-29 HISTORY — PX: INSERTION, STENT, DRUG-ELUTING, LACRIMAL CANALICULUS: SHX7453

## 2024-04-29 HISTORY — PX: CATARACT EXTRACTION W/PHACO: SHX586

## 2024-04-29 SURGERY — PHACOEMULSIFICATION, CATARACT, WITH IOL INSERTION
Anesthesia: Monitor Anesthesia Care | Site: Eye | Laterality: Left

## 2024-04-29 MED ORDER — MIDAZOLAM HCL 2 MG/2ML IJ SOLN
INTRAMUSCULAR | Status: AC
  Filled 2024-04-29: qty 2

## 2024-04-29 MED ORDER — SODIUM HYALURONATE 10 MG/ML IO SOLUTION
PREFILLED_SYRINGE | INTRAOCULAR | Status: DC | PRN
  Administered 2024-04-29: .85 mL via INTRAOCULAR

## 2024-04-29 MED ORDER — BSS IO SOLN
INTRAOCULAR | Status: DC | PRN
  Administered 2024-04-29: 15 mL via INTRAOCULAR

## 2024-04-29 MED ORDER — TROPICAMIDE 1 % OP SOLN
1.0000 [drp] | OPHTHALMIC | Status: AC | PRN
  Administered 2024-04-29 (×3): 1 [drp] via OPHTHALMIC

## 2024-04-29 MED ORDER — PHENYLEPHRINE-KETOROLAC 1-0.3 % IO SOLN
INTRAOCULAR | Status: DC | PRN
  Administered 2024-04-29: 500 mL via OPHTHALMIC

## 2024-04-29 MED ORDER — DEXAMETHASONE 0.4 MG OP INST
VAGINAL_INSERT | OPHTHALMIC | Status: AC
  Filled 2024-04-29: qty 1

## 2024-04-29 MED ORDER — POVIDONE-IODINE 5 % OP SOLN
OPHTHALMIC | Status: DC | PRN
  Administered 2024-04-29: 1 via OPHTHALMIC

## 2024-04-29 MED ORDER — DEXAMETHASONE 0.4 MG OP INST
VAGINAL_INSERT | OPHTHALMIC | Status: DC | PRN
  Administered 2024-04-29: .4 mg via OPHTHALMIC

## 2024-04-29 MED ORDER — LIDOCAINE HCL (PF) 1 % IJ SOLN
INTRAMUSCULAR | Status: DC | PRN
  Administered 2024-04-29: 1 mL

## 2024-04-29 MED ORDER — SODIUM CHLORIDE 0.9% FLUSH
INTRAVENOUS | Status: DC | PRN
Start: 2024-04-29 — End: 2024-04-29
  Administered 2024-04-29: 3 mL via INTRAVENOUS

## 2024-04-29 MED ORDER — PHENYLEPHRINE HCL 2.5 % OP SOLN
1.0000 [drp] | OPHTHALMIC | Status: AC | PRN
  Administered 2024-04-29 (×3): 1 [drp] via OPHTHALMIC

## 2024-04-29 MED ORDER — MIDAZOLAM HCL 2 MG/2ML IJ SOLN
INTRAMUSCULAR | Status: DC | PRN
  Administered 2024-04-29: 1 mg via INTRAVENOUS

## 2024-04-29 MED ORDER — LACTATED RINGERS IV SOLN: INTRAVENOUS | Status: DC

## 2024-04-29 MED ORDER — LIDOCAINE HCL 3.5 % OP GEL
1.0000 | Freq: Once | OPHTHALMIC | Status: AC
  Administered 2024-04-29: 1 via OPHTHALMIC

## 2024-04-29 MED ORDER — TETRACAINE HCL 0.5 % OP SOLN
1.0000 [drp] | OPHTHALMIC | Status: AC | PRN
  Administered 2024-04-29 (×3): 1 [drp] via OPHTHALMIC

## 2024-04-29 MED ORDER — SODIUM HYALURONATE 23MG/ML IO SOSY
PREFILLED_SYRINGE | INTRAOCULAR | Status: DC | PRN
  Administered 2024-04-29: .6 mL via INTRAOCULAR

## 2024-04-29 MED ORDER — MOXIFLOXACIN HCL 5 MG/ML IO SOLN
INTRAOCULAR | Status: DC | PRN
  Administered 2024-04-29: .2 mL via INTRACAMERAL

## 2024-04-29 MED ORDER — STERILE WATER FOR IRRIGATION IR SOLN
Status: DC | PRN
  Administered 2024-04-29: 1

## 2024-04-29 SURGICAL SUPPLY — 12 items
CLOTH BEACON ORANGE TIMEOUT ST (SAFETY) ×2 IMPLANT
EYE SHIELD UNIVERSAL CLEAR (GAUZE/BANDAGES/DRESSINGS) IMPLANT
FEE CATARACT SUITE SIGHTPATH (MISCELLANEOUS) ×2 IMPLANT
GLOVE BIOGEL PI IND STRL 7.0 (GLOVE) ×4 IMPLANT
LENS IOL TECNIS EYHANCE 20.0 (Intraocular Lens) IMPLANT
NDL HYPO 18GX1.5 BLUNT FILL (NEEDLE) ×2 IMPLANT
NEEDLE HYPO 18GX1.5 BLUNT FILL (NEEDLE) ×1 IMPLANT
PAD ARMBOARD POSITIONER FOAM (MISCELLANEOUS) ×2 IMPLANT
RING MALYGIN 7.0 (MISCELLANEOUS) IMPLANT
SYR TB 1ML LL NO SAFETY (SYRINGE) ×2 IMPLANT
TAPE SURG TRANSPORE 1 IN (GAUZE/BANDAGES/DRESSINGS) IMPLANT
WATER STERILE IRR 250ML POUR (IV SOLUTION) ×2 IMPLANT

## 2024-04-29 NOTE — Interval H&P Note (Signed)
 History and Physical Interval Note:  04/29/2024 12:59 PM  Cathy Sharp  has presented today for surgery, with the diagnosis of combined forms age related cataract, left eye.  The various methods of treatment have been discussed with the patient and family. After consideration of risks, benefits and other options for treatment, the patient has consented to  Procedure(s) with comments: PHACOEMULSIFICATION, CATARACT, WITH IOL INSERTION (Left) - CDE: INSERTION, STENT, DRUG-ELUTING, LACRIMAL CANALICULUS (Left) as a surgical intervention.  The patient's history has been reviewed, patient examined, no change in status, stable for surgery.  I have reviewed the patient's chart and labs.  Questions were answered to the patient's satisfaction.     HARRIE AGENT

## 2024-04-29 NOTE — Op Note (Signed)
 Date of procedure: 04/29/24  Pre-operative diagnosis: Visually significant age-related combined cataract, Left Eye (H25.812), Poor dilation of the Left eye  Post-operative diagnosis:  Visually significant age-related combined cataract, Left Eye (H25.812) Intra-operative floppy iris syndrome, Left eye (H21.81) 3.   Pain and inflammation following cataract surgery, Left Eye (H57.12)  Procedure:  Complex Removal of cataract via phacoemulsification and insertion of intra-ocular lens [employer] DIB00 +20.0D into the capsular bag of the Left Eye [phone]) 2. Placement of Dextenza Implant, Left Lower Lid  Attending surgeon: Lynwood LABOR. Wil Slape, MD, MA  Anesthesia: MAC, Topical [place]  Complications: None  Estimated Blood Loss: <82mL (minimal)  Specimens: None  Implants: As above  Indications:  Visually significant age-related cataract, Left Eye  Procedure:  The patient was seen and identified in the pre-operative area. The operative eye was identified and dilated.  The operative eye was marked.  Topical anesthesia was administered to the operative eye.     The patient was then to the operative suite and placed in the supine position.  A timeout was performed confirming the patient, procedure to be performed, and all other relevant information.   The patient's face was prepped and draped in the usual fashion for intra-ocular surgery.  A lid speculum was placed into the operative eye and the surgical microscope moved into place and focused. Poor dilation of the iris was confirmed. An inferotemporal paracentesis was created using a 20 gauge paracentesis blade.  Shugarcaine was injected into the anterior chamber.  Viscoelastic was injected into the anterior chamber.  A temporal clear-corneal main wound incision was created using a 2.58mm microkeratome.  A 7.42mm Malyugin ring was placed. A continuous curvilinear capsulorrhexis was initiated using an irrigating cystitome and completed using  capsulorrhexis forceps.  Hydrodissection and hydrodeliniation were performed.  Viscoelastic was injected into the anterior chamber.  A phacoemulsification handpiece and a chopper as a second instrument were used to remove the nucleus and epinucleus. The irrigation/aspiration handpiece was used to remove any remaining cortical material.   The capsular bag was reinflated with viscoelastic, checked, and found to be intact.  The intraocular lens was inserted into the capsular bag. The Malyugin ring was removed. The irrigation/aspiration handpiece was used to remove any remaining viscoelastic.  The clear corneal wound and paracentesis wounds were then hydrated and checked with Weck-Cels to be watertight. 0.1mL of Moxifloxacin was injected into the anterior chamber. The lid-speculum was removed.    The lower canaliculus was dilated and filled with Provisc. A Dextenza implant was placed in the lower canaliculus without complication.  The drape was removed.  The patient's face was cleaned with a wet and dry 4x4.   A clear shield was taped over the eye. The patient was taken to the post-operative care unit in good condition, having tolerated the procedure well.  Post-Op Instructions: The patient will follow up at [facility] for a same day post-operative evaluation and will receive all other orders and instructions.

## 2024-04-29 NOTE — Anesthesia Procedure Notes (Signed)
 Date/Time: 04/29/2024 1:08 PM  Performed by: Barbarann Verneita RAMAN, CRNAPre-anesthesia Checklist: Patient identified, Emergency Drugs available, Suction available, Timeout performed and Patient being monitored Patient Re-evaluated:Patient Re-evaluated prior to induction Oxygen Delivery Method: Nasal Cannula

## 2024-04-29 NOTE — Discharge Instructions (Addendum)
 Please discharge patient when stable, will follow up today with Dr. June Leap at the [facility] office immediately following discharge.  Leave shield in place until visit.  All paperwork with discharge instructions will be given at the office.  [facility] Address:  [address]  [place], [place] [address]

## 2024-04-29 NOTE — Anesthesia Preprocedure Evaluation (Addendum)
 Anesthesia Evaluation  Patient identified by MRN, date of birth, ID band Patient awake    Reviewed: Allergy & Precautions, H&P , NPO status , Patient's Chart, lab work & pertinent test results, reviewed documented beta blocker date and time   Airway Mallampati: II  TM Distance: >3 FB Neck ROM: full    Dental  (+) Dental Advisory Given, Upper Dentures   Pulmonary shortness of breath, former smoker   Pulmonary exam normal breath sounds clear to auscultation       Cardiovascular hypertension, + CAD, + Cardiac Stents, + CABG and + Peripheral Vascular Disease  Normal cardiovascular exam Rhythm:regular Rate:Normal  Good EF.  Grade 1 diastolic dysfunction.  Coronary disease asymptomatic at this time   Neuro/Psych  PSYCHIATRIC DISORDERS Anxiety Depression    Carotid stenosis negative neurological ROS     GI/Hepatic Neg liver ROS,GERD  ,,  Endo/Other  negative endocrine ROS    Renal/GU negative Renal ROS  negative genitourinary   Musculoskeletal  (+) Arthritis , Osteoarthritis,    Abdominal   Peds  Hematology negative hematology ROS (+)   Anesthesia Other Findings   Reproductive/Obstetrics negative OB ROS                              Anesthesia Physical Anesthesia Plan  ASA: 3  Anesthesia Plan: MAC   Post-op Pain Management:    Induction:   PONV Risk Score and Plan:   Airway Management Planned: Natural Airway and Nasal Cannula  Additional Equipment: None  Intra-op Plan:   Post-operative Plan:   Informed Consent: I have reviewed the patients History and Physical, chart, labs and discussed the procedure including the risks, benefits and alternatives for the proposed anesthesia with the patient or authorized representative who has indicated his/her understanding and acceptance.     Dental Advisory Given  Plan Discussed with: CRNA  Anesthesia Plan Comments:           Anesthesia Quick Evaluation

## 2024-04-29 NOTE — Anesthesia Postprocedure Evaluation (Signed)
 Anesthesia Post Note  Patient: Almarie FORBES Rudder  Procedure(s) Performed: PHACOEMULSIFICATION, CATARACT, WITH IOL INSERTION (Left: Eye) INSERTION, STENT, DRUG-ELUTING, LACRIMAL CANALICULUS (Left: Eye)  Patient location during evaluation: PACU Anesthesia Type: MAC Level of consciousness: awake and alert Pain management: pain level controlled Vital Signs Assessment: post-procedure vital signs reviewed and stable Respiratory status: spontaneous breathing, nonlabored ventilation, respiratory function stable and patient connected to nasal cannula oxygen Cardiovascular status: stable and blood pressure returned to baseline Postop Assessment: no apparent nausea or vomiting Anesthetic complications: no   There were no known notable events for this encounter.   Last Vitals:  Vitals:   04/29/24 1210 04/29/24 1331  BP: 139/60 126/60  Pulse: 80 88  Resp: 12 16  Temp: 36.7 C 36.7 C  SpO2: 98% 98%    Last Pain:  Vitals:   04/29/24 1331  TempSrc: Oral  PainSc: 0-No pain                 Reymundo Winship L Katlen Seyer

## 2024-04-29 NOTE — Transfer of Care (Signed)
 Immediate Anesthesia Transfer of Care Note  Patient: Cathy Sharp  Procedure(s) Performed: PHACOEMULSIFICATION, CATARACT, WITH IOL INSERTION (Left: Eye) INSERTION, STENT, DRUG-ELUTING, LACRIMAL CANALICULUS (Left: Eye)  Patient Location: Short Stay  Anesthesia Type:MAC  Level of Consciousness: awake and patient cooperative  Airway & Oxygen Therapy: Patient Spontanous Breathing  Post-op Assessment: Report given to RN and Post -op Vital signs reviewed and stable  Post vital signs: Reviewed and stable  Last Vitals:  Vitals Value Taken Time  BP 126/60 04/29/24 13:31  Temp 36.7 C 04/29/24 13:31  Pulse 88 04/29/24 13:31  Resp 16 04/29/24 13:31  SpO2 98 % 04/29/24 13:31    Last Pain:  Vitals:   04/29/24 1331  TempSrc: Oral  PainSc: 0-No pain      Patients Stated Pain Goal: 6 (04/29/24 1331)  Complications: No notable events documented.

## 2024-05-13 DIAGNOSIS — Z0000 Encounter for general adult medical examination without abnormal findings: Secondary | ICD-10-CM | POA: Diagnosis not present

## 2024-05-13 DIAGNOSIS — R5383 Other fatigue: Secondary | ICD-10-CM | POA: Diagnosis not present

## 2024-05-13 DIAGNOSIS — E559 Vitamin D deficiency, unspecified: Secondary | ICD-10-CM | POA: Diagnosis not present

## 2024-05-13 DIAGNOSIS — Z299 Encounter for prophylactic measures, unspecified: Secondary | ICD-10-CM | POA: Diagnosis not present

## 2024-05-13 DIAGNOSIS — R52 Pain, unspecified: Secondary | ICD-10-CM | POA: Diagnosis not present

## 2024-05-13 DIAGNOSIS — I10 Essential (primary) hypertension: Secondary | ICD-10-CM | POA: Diagnosis not present

## 2024-05-13 DIAGNOSIS — Z23 Encounter for immunization: Secondary | ICD-10-CM | POA: Diagnosis not present

## 2024-05-13 DIAGNOSIS — E7800 Pure hypercholesterolemia, unspecified: Secondary | ICD-10-CM | POA: Diagnosis not present

## 2024-05-16 ENCOUNTER — Encounter: Payer: Self-pay | Admitting: General Surgery

## 2024-05-17 ENCOUNTER — Ambulatory Visit: Payer: Medicare (Managed Care) | Attending: Cardiology | Admitting: Cardiology

## 2024-05-17 ENCOUNTER — Encounter: Payer: Self-pay | Admitting: Cardiology

## 2024-05-17 VITALS — BP 124/60 | HR 82 | Ht 63.0 in | Wt 144.6 lb

## 2024-05-17 DIAGNOSIS — I10 Essential (primary) hypertension: Secondary | ICD-10-CM

## 2024-05-17 DIAGNOSIS — I6529 Occlusion and stenosis of unspecified carotid artery: Secondary | ICD-10-CM | POA: Diagnosis not present

## 2024-05-17 DIAGNOSIS — I2510 Atherosclerotic heart disease of native coronary artery without angina pectoris: Secondary | ICD-10-CM | POA: Diagnosis not present

## 2024-05-17 DIAGNOSIS — E782 Mixed hyperlipidemia: Secondary | ICD-10-CM | POA: Diagnosis not present

## 2024-05-17 NOTE — Progress Notes (Signed)
 Clinical Summary Cathy Sharp is a 78 y.o.female seen today for follow up of the following medical problems.        1. CAD - previous PCI 09/2008 for UA, received DES to LCX - history of prior CABG in 2000 at [facility].  - 09/2015 [place] nuclear stress: no ischemia, LVEF 69%     05/2021 echo: LVEF 60-65%, no WMAs, grade I diastolic dysfunction, normal RV function 05/2021 nuclear stress: no ischemia     - no recent exertional symptoms - compliant with meds       2. Hyperlipidemia - muscle aches on lipitor,she is on pravastatin  and tolerating well  04/2021 TC 180 TG 194 HDL 46 LDL 101  04/2022 TC 137 TG 169 HDL 47 LDL 62.  - 04/2023 TC 132 TG 242 HDL 47 LDL 47    04/2024 TC 141 TG 120 HDL 52 LDL 68   3. Carotid stenosis - mild bilateral disease 2011  - carotid US  01/2016 with mild bilateral disease.  05/2021 carotid US : mild plaque without stenosis     4. PAD - 1999 had aortobifem bypass. Followed by vascular with appt 04/2016, recs to f/u only as needed.  - no clauditcation.        5. HTN - compliant with meds    6. CKD  - followed by pcp   SH:. 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, great great grand 9 months ago     Past Medical History:  Diagnosis Date   Anxiety and depression    Arthritis    CAD (coronary artery disease)    PCI, March, 2010  /   nuclear, July, 2010, no scar or ischemia   Carotid artery disease    Doppler November, 2011, mild atherosclerotic disease, less than 50% stenosis  LICA,  no significant stenosis or ICA,, vertebral is antegrade   Dyslipidemia    GERD (gastroesophageal reflux disease)    Hx of CABG    2000   Hx of carpal tunnel syndrome    Hypertension    PAD (peripheral artery disease)    Aortobifem bypass, January, 1999   Schatzki's ring    Rourk   Shortness of breath      Allergies  Allergen Reactions   Penicillins      Current Outpatient Medications  Medication Sig Dispense Refill   amLODipine   (NORVASC ) 5 MG tablet Take 5 mg by mouth daily.  6   aspirin 81 MG tablet Take 81 mg by mouth daily.     buPROPion (WELLBUTRIN XL) 150 MG 24 hr tablet Take 150 mg by mouth daily.     Cholecalciferol (VITAMIN D3) 250 MCG (10000 UT) capsule Take 10,000 Units by mouth daily.     clonazePAM (KLONOPIN) 0.5 MG tablet Take 1 mg by mouth at bedtime.      losartan-hydrochlorothiazide (HYZAAR) 50-12.5 MG tablet Take 1 tablet by mouth daily.  3   nitroGLYCERIN  (NITROSTAT ) 0.4 MG SL tablet Place 1 tablet (0.4 mg total) under the tongue every 5 (five) minutes as needed. 25 tablet 3   pravastatin  (PRAVACHOL ) 80 MG tablet Take 80 mg by mouth daily.     pravastatin  (PRAVACHOL ) 40 MG tablet Take 2 tablets (80 mg total) by mouth daily. (Patient not taking: Reported on 05/17/2024)     No current facility-administered medications for this visit.     Past Surgical History:  Procedure Laterality Date   AORTOBIFEMORAL BYPASS GRAFTING  07/28/1997   CATARACT  EXTRACTION W/PHACO Left 04/29/2024   Procedure: PHACOEMULSIFICATION, CATARACT, WITH IOL INSERTION;  Surgeon: Harrie Agent, MD;  Location: [facility];  Service: Ophthalmology;  Laterality: Left;  CDE: 11.08   CORONARY ARTERY BYPASS GRAFT  07/28/1998   CYST REMOVAL FROM BREAST     LEFT   HEMORRHOID SURGERY     HERNIA REPAIR     umbilical   INSERTION, STENT, DRUG-ELUTING, LACRIMAL CANALICULUS Left 04/29/2024   Procedure: INSERTION, STENT, DRUG-ELUTING, LACRIMAL CANALICULUS;  Surgeon: Harrie Agent, MD;  Location: [facility];  Service: Ophthalmology;  Laterality: Left;   TUBAL LIGATION       Allergies  Allergen Reactions   Penicillins       Family History  Problem Relation Age of Onset   Heart attack Sister        deceased   Heart attack Brother 88       deceased     Social History Cathy Sharp reports that she quit smoking about 26 years ago. Her smoking use included cigarettes. She has never used smokeless tobacco. Cathy Sharp reports no  history of alcohol  use.    Physical Examination Today's Vitals   05/17/24 1356 05/17/24 1439  BP: (!) 142/82 124/60  Pulse: 82   SpO2: 96%   Weight: 144 lb 9.6 oz (65.6 kg)   Height: 5' 3 (1.6 m)    Body mass index is 25.61 kg/m.  Gen: resting comfortably, no acute distress HEENT: no scleral icterus, pupils equal round and reactive, no palptable cervical adenopathy,  CV: RRR, no m/rg, no jvd Resp: Clear to auscultation bilaterally GI: abdomen is soft, non-tender, non-distended, normal bowel sounds, no hepatosplenomegaly MSK: extremities are warm, no edema.  Skin: warm, no rash Neuro:  no focal deficits Psych: appropriate affect   Diagnostic Studies  09/2008 Cath RESULTS:  Left main coronary artery:  The left main coronary artery was  free of significant disease.    Left anterior descending artery:  The left anterior ascending artery had  90% stenosis located just after the takeoff of the first septal  perforator and first diagonal Raffael Bugarin.  There was competing flow in the  first diagonal Kobie Whidby and there was 90% ostial stenosis in the first  diagonal Curtina Grills.    Circumflex artery:  The circumflex artery gave rise to an atrial Menno Vanbergen  and then had a 99% stenosis feeding a marginal Sevilla Murtagh.    Right coronary artery:  The right coronary artery had some  irregularities in its midportion.  The posterior descending Norma Montemurro  appeared to have a subbranch that was totally occluded, although it  appeared likely that this was a small vessel.    The saphenous vein graft to the right coronary artery was completely  occluded at its origin.    The LIMA graft to the LAD was atretic and possibly completely occluded  at its midportion.  It certainly was functionally occluded.     09/2008 Cath Invertention RESULTS:  Initially stenosis in the mid circumflex artery was 99% with  TIMI II flow.  Following stenting, this improved to 0% with TIMI III  flow.    CONCLUSION:  Successful PCI  of the chronic total occlusion of the mid  circumflex artery using a XIENCE drug-eluting stent with improvement in  center narrowing from 99% to 0% and improvement of flow from TIMI II to  TIMI III flow.    DISPOSITION:  The patient returned to post angio room for further  observation.       05/2021  echo   IMPRESSIONS     1. Left ventricular ejection fraction, by estimation, is 60 to 65%. The  left ventricle has normal function. The left ventricle has no regional  wall motion abnormalities. Left ventricular diastolic parameters are  consistent with Grade I diastolic  dysfunction (impaired relaxation).   2. Right ventricular systolic function is normal. The right ventricular  size is normal. Tricuspid regurgitation signal is inadequate for assessing  PA pressure.   3. The mitral valve is normal in structure. No evidence of mitral valve  regurgitation. No evidence of mitral stenosis.   4. The aortic valve is tricuspid. Aortic valve regurgitation is not  visualized. No aortic stenosis is present.   5. The inferior vena cava is normal in size with greater than 50%  respiratory variability, suggesting right atrial pressure of 3 mmHg.    05/2021 carotid US  IMPRESSION: Color duplex indicates minimal heterogeneous and calcified plaque, with no hemodynamically significant stenosis by duplex criteria in the extracranial cerebrovascular circulation.   Assessment and Plan   1. CAD - no recent symptoms, continue current meds     2. Hyperlipidemia - LDL has trended above goal of <55. Was at goal last year. Repeat lipids at our next appt, if remains elevated likely add zetia. Had side effects to lipitor in the past, other option would be to try crestor      3. HTN - her bp is at goal, continue current meds  4.Caroitd stenosis, - repeat US   Dorn PHEBE Ross, M.D.

## 2024-05-17 NOTE — Patient Instructions (Signed)
 Medication Instructions:  Continue all current medications.  Labwork: none  Testing/Procedures: Your physician has requested that you have a carotid duplex. This test is an ultrasound of the carotid arteries in your neck. It looks at blood flow through these arteries that supply the brain with blood. Allow one hour for this exam. There are no restrictions or special instructions. Office will contact with results via phone, letter or mychart.     Follow-Up: 6 months   Any Other Special Instructions Will Be Listed Below (If Applicable).   If you need a refill on your cardiac medications before your next appointment, please call your pharmacy.

## 2024-06-01 ENCOUNTER — Ambulatory Visit: Payer: Medicare (Managed Care) | Attending: Cardiology

## 2024-06-01 DIAGNOSIS — I6523 Occlusion and stenosis of bilateral carotid arteries: Secondary | ICD-10-CM

## 2024-06-01 DIAGNOSIS — I6529 Occlusion and stenosis of unspecified carotid artery: Secondary | ICD-10-CM

## 2024-06-03 ENCOUNTER — Ambulatory Visit: Payer: Self-pay | Admitting: Cardiology

## 2024-06-06 NOTE — Telephone Encounter (Signed)
 Notified, copy to pcp.

## 2024-06-06 NOTE — Telephone Encounter (Signed)
-----   Message from Alvan Carrier sent at 06/03/2024  1:39 PM EST ----- Mild plaque in the carotid arteries, we will continue to monitor  JINNY Alvan MD ----- Message ----- From: Interface, [employer] Sent: 06/01/2024   3:37 PM EST To: Carrier JULIANNA Alvan, MD
